# Patient Record
Sex: Male | Born: 1950 | Race: White | Hispanic: No | Marital: Married | Smoking: Never smoker
Health system: Southern US, Community
[De-identification: ages and names within clinical notes are randomized; demographics above are authoritative.]

## PROBLEM LIST (undated history)

## (undated) DIAGNOSIS — R112 Nausea with vomiting, unspecified: Secondary | ICD-10-CM

## (undated) DIAGNOSIS — M1990 Unspecified osteoarthritis, unspecified site: Secondary | ICD-10-CM

## (undated) DIAGNOSIS — R609 Edema, unspecified: Secondary | ICD-10-CM

## (undated) DIAGNOSIS — R0600 Dyspnea, unspecified: Secondary | ICD-10-CM

## (undated) DIAGNOSIS — T4145XA Adverse effect of unspecified anesthetic, initial encounter: Secondary | ICD-10-CM

## (undated) DIAGNOSIS — G4730 Sleep apnea, unspecified: Secondary | ICD-10-CM

## (undated) DIAGNOSIS — M109 Gout, unspecified: Secondary | ICD-10-CM

## (undated) DIAGNOSIS — K766 Portal hypertension: Secondary | ICD-10-CM

## (undated) DIAGNOSIS — D649 Anemia, unspecified: Secondary | ICD-10-CM

## (undated) DIAGNOSIS — Z9889 Other specified postprocedural states: Secondary | ICD-10-CM

## (undated) DIAGNOSIS — R0602 Shortness of breath: Secondary | ICD-10-CM

## (undated) DIAGNOSIS — T8859XA Other complications of anesthesia, initial encounter: Secondary | ICD-10-CM

## (undated) DIAGNOSIS — K5900 Constipation, unspecified: Secondary | ICD-10-CM

## (undated) DIAGNOSIS — R279 Unspecified lack of coordination: Secondary | ICD-10-CM

## (undated) DIAGNOSIS — K219 Gastro-esophageal reflux disease without esophagitis: Secondary | ICD-10-CM

## (undated) DIAGNOSIS — E7800 Pure hypercholesterolemia, unspecified: Secondary | ICD-10-CM

## (undated) DIAGNOSIS — I10 Essential (primary) hypertension: Secondary | ICD-10-CM

## (undated) DIAGNOSIS — D696 Thrombocytopenia, unspecified: Secondary | ICD-10-CM

## (undated) DIAGNOSIS — R161 Splenomegaly, not elsewhere classified: Secondary | ICD-10-CM

## (undated) DIAGNOSIS — E119 Type 2 diabetes mellitus without complications: Secondary | ICD-10-CM

## (undated) DIAGNOSIS — N189 Chronic kidney disease, unspecified: Secondary | ICD-10-CM

## (undated) DIAGNOSIS — K7460 Unspecified cirrhosis of liver: Secondary | ICD-10-CM

## (undated) DIAGNOSIS — G5600 Carpal tunnel syndrome, unspecified upper limb: Secondary | ICD-10-CM

## (undated) DIAGNOSIS — M6281 Muscle weakness (generalized): Secondary | ICD-10-CM

## (undated) DIAGNOSIS — R42 Dizziness and giddiness: Secondary | ICD-10-CM

## (undated) DIAGNOSIS — M7070 Other bursitis of hip, unspecified hip: Secondary | ICD-10-CM

## (undated) HISTORY — PX: ROTATOR CUFF REPAIR: SHX139

## (undated) HISTORY — PX: CATARACT EXTRACTION W/ INTRAOCULAR LENS IMPLANT: SHX1309

## (undated) HISTORY — PX: EYE SURGERY: SHX253

## (undated) HISTORY — PX: LIVER BIOPSY: SHX301

## (undated) HISTORY — PX: COLONOSCOPY: SHX174

## (undated) HISTORY — PX: JOINT REPLACEMENT: SHX530

## (undated) HISTORY — PX: CARPAL TUNNEL RELEASE: SHX101

---
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
## 2008-11-10 ENCOUNTER — Ambulatory Visit: Payer: Self-pay | Admitting: Endocrinology

## 2009-05-14 ENCOUNTER — Ambulatory Visit: Payer: Self-pay | Admitting: Gastroenterology

## 2012-07-29 ENCOUNTER — Ambulatory Visit: Payer: Self-pay | Admitting: Gastroenterology

## 2012-08-23 LAB — PATHOLOGY REPORT

## 2012-09-09 ENCOUNTER — Ambulatory Visit: Payer: Self-pay

## 2013-03-26 ENCOUNTER — Ambulatory Visit: Payer: Self-pay

## 2013-03-26 LAB — COMPREHENSIVE METABOLIC PANEL
Albumin: 4.1 g/dL (ref 3.4–5.0)
Alkaline Phosphatase: 107 U/L
Anion Gap: 11 (ref 7–16)
BUN: 58 mg/dL — AB (ref 7–18)
Bilirubin,Total: 0.6 mg/dL (ref 0.2–1.0)
CALCIUM: 9.8 mg/dL (ref 8.5–10.1)
CHLORIDE: 107 mmol/L (ref 98–107)
Co2: 19 mmol/L — ABNORMAL LOW (ref 21–32)
Creatinine: 2.81 mg/dL — ABNORMAL HIGH (ref 0.60–1.30)
EGFR (African American): 27 — ABNORMAL LOW
GFR CALC NON AF AMER: 23 — AB
GLUCOSE: 150 mg/dL — AB (ref 65–99)
OSMOLALITY: 293 (ref 275–301)
Potassium: 5.6 mmol/L — ABNORMAL HIGH (ref 3.5–5.1)
SGOT(AST): 52 U/L — ABNORMAL HIGH (ref 15–37)
SGPT (ALT): 95 U/L — ABNORMAL HIGH (ref 12–78)
SODIUM: 137 mmol/L (ref 136–145)
Total Protein: 8.5 g/dL — ABNORMAL HIGH (ref 6.4–8.2)

## 2013-03-26 LAB — CBC WITH DIFFERENTIAL/PLATELET
Basophil #: 0.1 10*3/uL (ref 0.0–0.1)
Basophil %: 0.3 %
Eosinophil #: 0.9 10*3/uL — ABNORMAL HIGH (ref 0.0–0.7)
Eosinophil %: 5.5 %
HCT: 55.9 % — ABNORMAL HIGH (ref 40.0–52.0)
HGB: 18.7 g/dL — ABNORMAL HIGH (ref 13.0–18.0)
Lymphocyte #: 1.8 10*3/uL (ref 1.0–3.6)
Lymphocyte %: 11 %
MCH: 32.4 pg (ref 26.0–34.0)
MCHC: 33.5 g/dL (ref 32.0–36.0)
MCV: 97 fL (ref 80–100)
Monocyte #: 1.3 x10 3/mm — ABNORMAL HIGH (ref 0.2–1.0)
Monocyte %: 8 %
Neutrophil #: 12.1 10*3/uL — ABNORMAL HIGH (ref 1.4–6.5)
Neutrophil %: 75.2 %
Platelet: 198 10*3/uL (ref 150–440)
RBC: 5.77 10*6/uL (ref 4.40–5.90)
RDW: 14.4 % (ref 11.5–14.5)
WBC: 16 10*3/uL — ABNORMAL HIGH (ref 3.8–10.6)

## 2013-03-27 LAB — CLOSTRIDIUM DIFFICILE(ARMC)

## 2013-03-30 LAB — STOOL CULTURE

## 2013-10-27 ENCOUNTER — Ambulatory Visit: Payer: Self-pay | Admitting: Emergency Medicine

## 2013-10-27 LAB — URINALYSIS, COMPLETE
Bacteria: NEGATIVE
Bilirubin,UR: NEGATIVE
Blood: NEGATIVE
Glucose,UR: NEGATIVE
KETONE: NEGATIVE
LEUKOCYTE ESTERASE: NEGATIVE
Nitrite: NEGATIVE
Ph: 5 (ref 5.0–8.0)
Protein: NEGATIVE
RBC, UR: NONE SEEN /HPF (ref 0–5)
Specific Gravity: 1.01 (ref 1.000–1.030)
Squamous Epithelial: NONE SEEN

## 2014-11-14 ENCOUNTER — Ambulatory Visit
Admission: EM | Admit: 2014-11-14 | Discharge: 2014-11-14 | Disposition: A | Discharge status: Home / Self Care | Payer: Commercial Managed Care - PPO | Attending: Family Medicine | Admitting: Family Medicine

## 2014-11-14 DIAGNOSIS — H8110 Benign paroxysmal vertigo, unspecified ear: Secondary | ICD-10-CM

## 2014-11-14 HISTORY — DX: Gout, unspecified: M10.9

## 2014-11-14 HISTORY — DX: Dizziness and giddiness: R42

## 2014-11-14 HISTORY — DX: Gastro-esophageal reflux disease without esophagitis: K21.9

## 2014-11-14 HISTORY — DX: Pure hypercholesterolemia, unspecified: E78.00

## 2014-11-14 HISTORY — DX: Type 2 diabetes mellitus without complications: E11.9

## 2014-11-14 HISTORY — DX: Essential (primary) hypertension: I10

## 2014-11-14 MED ORDER — MECLIZINE HCL 25 MG PO TABS: 25.0000 mg | ORAL_TABLET | Freq: Three times a day (TID) | ORAL | Status: DC | PRN

## 2014-11-14 MED ORDER — ONDANSETRON 8 MG PO TBDP: 8.0000 mg | ORAL_TABLET | Freq: Two times a day (BID) | ORAL | Status: DC

## 2014-11-14 NOTE — Discharge Instructions (Signed)
Benign Positional Vertigo Vertigo is the feeling that you or your surroundings are moving when they are not. Benign positional vertigo is the most common form of vertigo. The cause of this condition is not serious (is benign). This condition is triggered by certain movements and positions (is positional). This condition can be dangerous if it occurs while you are doing something that could endanger you or others, such as driving.  CAUSES In many cases, the cause of this condition is not known. It may be caused by a disturbance in an area of the inner ear that helps your brain to sense movement and balance. This disturbance can be caused by a viral infection (labyrinthitis), head injury, or repetitive motion. RISK FACTORS This condition is more likely to develop in: 1. Women. 2. People who are 50 years of age or older. SYMPTOMS Symptoms of this condition usually happen when you move your head or your eyes in different directions. Symptoms may start suddenly, and they usually last for less than a minute. Symptoms may include:  Loss of balance and falling.  Feeling like you are spinning or moving.  Feeling like your surroundings are spinning or moving.  Nausea and vomiting.  Blurred vision.  Dizziness.  Involuntary eye movement (nystagmus). Symptoms can be mild and cause only slight annoyance, or they can be severe and interfere with daily life. Episodes of benign positional vertigo may return (recur) over time, and they may be triggered by certain movements. Symptoms may improve over time. DIAGNOSIS This condition is usually diagnosed by medical history and a physical exam of the head, neck, and ears. You may be referred to a health care provider who specializes in ear, nose, and throat (ENT) problems (otolaryngologist) or a provider who specializes in disorders of the nervous system (neurologist). You may have additional testing, including:  MRI.  A CT scan.  Eye movement tests. Your  health care provider may ask you to change positions quickly while he or she watches you for symptoms of benign positional vertigo, such as nystagmus. Eye movement may be tested with an electronystagmogram (ENG), caloric stimulation, the Dix-Hallpike test, or the roll test.  An electroencephalogram (EEG). This records electrical activity in your brain.  Hearing tests. TREATMENT Usually, your health care provider will treat this by moving your head in specific positions to adjust your inner ear back to normal. Surgery may be needed in severe cases, but this is rare. In some cases, benign positional vertigo may resolve on its own in 2-4 weeks. HOME CARE INSTRUCTIONS Safety  Move slowly.Avoid sudden body or head movements.  Avoid driving.  Avoid operating heavy machinery.  Avoid doing any tasks that would be dangerous to you or others if a vertigo episode would occur.  If you have trouble walking or keeping your balance, try using a cane for stability. If you feel dizzy or unstable, sit down right away.  Return to your normal activities as told by your health care provider. Ask your health care provider what activities are safe for you. General Instructions  Take over-the-counter and prescription medicines only as told by your health care provider.  Avoid certain positions or movements as told by your health care provider.  Drink enough fluid to keep your urine clear or pale yellow.  Keep all follow-up visits as told by your health care provider. This is important. SEEK MEDICAL CARE IF:  You have a fever.  Your condition gets worse or you develop new symptoms.  Your family or friends   notice any behavioral changes.  Your nausea or vomiting gets worse.  You have numbness or a "pins and needles" sensation. SEEK IMMEDIATE MEDICAL CARE IF:  You have difficulty speaking or moving.  You are always dizzy.  You faint.  You develop severe headaches.  You have weakness in your  legs or arms.  You have changes in your hearing or vision.  You develop a stiff neck.  You develop sensitivity to light.   This information is not intended to replace advice given to you by your health care provider. Make sure you discuss any questions you have with your health care provider.   Document Released: 09/30/2005 Document Revised: 09/13/2014 Document Reviewed: 04/17/2014 Elsevier Interactive Patient Education 2016 [employer] Epley Maneuver Self-Care WHAT IS THE EPLEY MANEUVER? The Epley maneuver is an exercise you can do to relieve symptoms of benign paroxysmal positional vertigo (BPPV). This condition is often just referred to as vertigo. BPPV is caused by the movement of tiny crystals (canaliths) inside your inner ear. The accumulation and movement of canaliths in your inner ear causes a sudden spinning sensation (vertigo) when you move your head to certain positions. Vertigo usually lasts about 30 seconds. BPPV usually occurs in just one ear. If you get vertigo when you lie on your left side, you probably have BPPV in your left ear. Your health care provider can tell you which ear is involved.  BPPV may be caused by a head injury. Many people older than 50 get BPPV for unknown reasons. If you have been diagnosed with BPPV, your health care provider may teach you how to do this maneuver. BPPV is not life threatening (benign) and usually goes away in time.  WHEN SHOULD I PERFORM THE EPLEY MANEUVER? You can do this maneuver at home whenever you have symptoms of vertigo. You may do the Epley maneuver up to 3 times a day until your symptoms of vertigo go away. HOW SHOULD I DO THE EPLEY MANEUVER? 3. Sit on the edge of a bed or table with your back straight. Your legs should be extended or hanging over the edge of the bed or table.  4. Turn your head halfway toward the affected ear.  5. Lie backward quickly with your head turned until you are lying flat on your back. You may want to  position a pillow under your shoulders.  6. Hold this position for 30 seconds. You may experience an attack of vertigo. This is normal. Hold this position until the vertigo stops. 7. Then turn your head to the opposite direction until your unaffected ear is facing the floor.  8. Hold this position for 30 seconds. You may experience an attack of vertigo. This is normal. Hold this position until the vertigo stops. 9. Now turn your whole body to the same side as your head. Hold for another 30 seconds.  10. You can then sit back up. ARE THERE RISKS TO THIS MANEUVER? In some cases, you may have other symptoms (such as changes in your vision, weakness, or numbness). If you have these symptoms, stop doing the maneuver and call your health care provider. Even if doing these maneuvers relieves your vertigo, you may still have dizziness. Dizziness is the sensation of light-headedness but without the sensation of movement. Even though the Epley maneuver may relieve your vertigo, it is possible that your symptoms will return within 5 years. WHAT SHOULD I DO AFTER THIS MANEUVER? After doing the Epley maneuver, you can return to your normal   activities. Ask your doctor if there is anything you should do at home to prevent vertigo. This may include:  Sleeping with two or more pillows to keep your head elevated.  Not sleeping on the side of your affected ear.  Getting up slowly from bed.  Avoiding sudden movements during the day.  Avoiding extreme head movement, like looking up or bending over.  Wearing a cervical collar to prevent sudden head movements. WHAT SHOULD I DO IF MY SYMPTOMS GET WORSE? Call your health care provider if your vertigo gets worse. Call your provider right way if you have other symptoms, including:   Nausea.  Vomiting.  Headache.  Weakness.  Numbness.  Vision changes.   This information is not intended to replace advice given to you by your health care provider. Make  sure you discuss any questions you have with your health care provider.   Document Released: 12/28/2012 Document Reviewed: 12/28/2012 Elsevier Interactive Patient Education 2016 Elsevier Inc.  

## 2014-11-14 NOTE — ED Notes (Signed)
Started sudden onset at midnite when got up off the couch being very dizzy. Went to bed and at 2am became very dizzy and vomited. Vomited x 4. Took Zofran which helped. Got out of bed at 9am and remained somewhat dizzy and nauseated-took another Zofran. Normal BM yesterday, no diarrhea.

## 2014-11-14 NOTE — ED Provider Notes (Signed)
CSN: [id number]     Arrival date & time 11/14/14  1013 History   None    Chief Complaint  Patient presents with  . Dizziness   (Consider location/radiation/quality/duration/timing/severity/associated sxs/prior Treatment) HPI Comments: 64 yo male presents with a complaint of sudden onset of dizziness last night before going to bed. States he got off the couch to go the bed and had vertigo ("felt like the room was spinning"). Went to bed but woke up around 2am and had the same vertigo symptoms. Vomited several times then and took a zofran which helped. This morning around 9am had similar vertigo episode and took another zofran. Currently states feels a little better and denies any dizziness or vertigo. Denies any vision changes, numbness/tingling, weakness, speech or swallowing problems.   The history is provided by the patient.    Past Medical History  Diagnosis Date  . Diabetes mellitus without complication (HCC)   . Hypertension   . Hypercholesteremia   . GERD (gastroesophageal reflux disease)   . Vertigo   . Gout    Past Surgical History  Procedure Laterality Date  . Joint replacement     Family History  Problem Relation Age of Onset  . Cancer Father    Social History  Substance Use Topics  . Smoking status: Never Smoker   . Smokeless tobacco: None  . Alcohol Use: No    Review of Systems  Allergies  Review of patient's allergies indicates no known allergies.  Home Medications   Prior to Admission medications   Medication Sig Start Date End Date Taking? Authorizing Provider  aspirin 81 MG tablet Take 81 mg by mouth daily.   Yes Historical Provider, MD  ergocalciferol (VITAMIN D2) 50000 UNITS capsule Take 50,000 Units by mouth once a week.   Yes Historical Provider, MD  ezetimibe (ZETIA) 10 MG tablet Take 10 mg by mouth daily.   Yes Historical Provider, MD  febuxostat (ULORIC) 40 MG tablet Take 40 mg by mouth daily.   Yes Historical Provider, MD  insulin glargine  (LANTUS) 100 UNIT/ML injection Inject 60 Units into the skin at bedtime.   Yes Historical Provider, MD  lisinopril-hydrochlorothiazide (PRINZIDE,ZESTORETIC) 20-25 MG tablet Take 1 tablet by mouth daily.   Yes Historical Provider, MD  omeprazole (PRILOSEC) 40 MG capsule Take 40 mg by mouth daily.   Yes Historical Provider, MD  sitaGLIPtin-metformin (JANUMET) 50-1000 MG tablet Take 1 tablet by mouth 2 (two) times daily with a meal.   Yes Historical Provider, MD  meclizine (ANTIVERT) 25 MG tablet Take 1 tablet (25 mg total) by mouth 3 (three) times daily as needed. 11/14/14   Payton Mccallumrlando Pericles Carmicheal, MD  ondansetron (ZOFRAN ODT) 8 MG disintegrating tablet Take 1 tablet (8 mg total) by mouth 2 (two) times daily. 11/14/14   Payton Mccallumrlando Mellonie Guess, MD   Meds Ordered and Administered this Visit  Medications - No data to display  BP 116/74 mmHg  Pulse 58  Temp(Src) 96.9 F (36.1 C) (Tympanic)  Resp 16  Ht 5\' 9"  (1.753 m)  Wt 255 lb (115.667 kg)  BMI 37.64 kg/m2  SpO2 98% Orthostatic VS for the past 24 hrs:  BP- Lying Pulse- Lying BP- Sitting Pulse- Sitting BP- Standing at 0 minutes Pulse- Standing at 0 minutes  11/14/14 1042 126/71 mmHg 58 131/73 mmHg 59 124/75 mmHg 74    Physical Exam  Constitutional: He is oriented to person, place, and time. He appears well-developed and well-nourished. No distress.  Eyes: EOM are normal. Pupils are equal,  round, and reactive to light. Right eye exhibits no discharge. Left eye exhibits no discharge.  Neck: Neck supple. No tracheal deviation present. No thyromegaly present.  Cardiovascular: Regular rhythm and normal heart sounds.   Pulmonary/Chest: Effort normal. No respiratory distress.  Lymphadenopathy:    He has no cervical adenopathy.  Neurological: He is alert and oriented to person, place, and time. He has normal strength and normal reflexes. He displays normal reflexes. No cranial nerve deficit or sensory deficit. He exhibits normal muscle tone. Coordination normal.   Positive Hallpike maneuver  Skin: No rash noted. He is not diaphoretic.  Psychiatric: He has a normal mood and affect.  Nursing note and vitals reviewed.   ED Course  Procedures (including critical care time)  Labs Review Labs Reviewed - No data to display  Imaging Review No results found.   Visual Acuity Review  Right Eye Distance:   Left Eye Distance:   Bilateral Distance:    Right Eye Near:   Left Eye Near:    Bilateral Near:         MDM   1. Benign positional vertigo, unspecified laterality    Discharge Medication List as of 11/14/2014 11:58 AM    START taking these medications   Details  meclizine (ANTIVERT) 25 MG tablet Take 1 tablet (25 mg total) by mouth 3 (three) times daily as needed., Starting 11/14/2014, Until Discontinued, Normal    ondansetron (ZOFRAN ODT) 8 MG disintegrating tablet Take 1 tablet (8 mg total) by mouth 2 (two) times daily., Starting 11/14/2014, Until Discontinued, Normal      1.  diagnosis reviewed with patient 2. rx as per orders above; reviewed possible side effects, interactions, risks and benefits  3. Recommend supportive treatment with modified Epley maneuver exercises at home 4. Follow-up prn if symptoms worsen or don't improve    Payton Mccallum, MD 11/14/14 1243

## 2015-01-02 ENCOUNTER — Other Ambulatory Visit: Payer: Self-pay | Admitting: Otolaryngology

## 2015-01-02 DIAGNOSIS — H814 Vertigo of central origin: Secondary | ICD-10-CM

## 2015-01-02 DIAGNOSIS — R42 Dizziness and giddiness: Secondary | ICD-10-CM

## 2015-01-04 ENCOUNTER — Ambulatory Visit: Payer: Commercial Managed Care - PPO

## 2017-05-13 NOTE — Anesthesia Pain Management Evaluation Note (Signed)
AT REQUEST OF DR DIAZ OFFICE , DISCUSSED PATIENT HISTORY WITH DR A KARENZ. IF CLEARED BY MEDICAL AND GI AND ASYMPTOMATIC CAN HAVE SURGERY AT THIS FACILITY. Dillon Graves AT DR Trisha Mangle NOTIFIED

## 2017-07-15 HISTORY — PX: CHOLECYSTECTOMY: SHX55

## 2017-09-11 ENCOUNTER — Other Ambulatory Visit: Payer: Self-pay | Admitting: Internal Medicine

## 2017-09-11 DIAGNOSIS — K7460 Unspecified cirrhosis of liver: Secondary | ICD-10-CM

## 2017-09-17 ENCOUNTER — Ambulatory Visit: Payer: Commercial Managed Care - PPO

## 2017-09-23 ENCOUNTER — Ambulatory Visit
Admission: RE | Admit: 2017-09-23 | Discharge: 2017-09-23 | Disposition: A | Discharge status: Home / Self Care | Payer: Commercial Managed Care - PPO | Source: Ambulatory Visit | Attending: Internal Medicine | Admitting: Internal Medicine

## 2017-09-23 DIAGNOSIS — Z9049 Acquired absence of other specified parts of digestive tract: Secondary | ICD-10-CM | POA: Diagnosis not present

## 2017-09-23 DIAGNOSIS — K766 Portal hypertension: Secondary | ICD-10-CM | POA: Diagnosis present

## 2017-09-23 DIAGNOSIS — K7460 Unspecified cirrhosis of liver: Secondary | ICD-10-CM | POA: Insufficient documentation

## 2018-03-12 ENCOUNTER — Other Ambulatory Visit: Payer: Self-pay | Admitting: Internal Medicine

## 2018-03-12 DIAGNOSIS — K7460 Unspecified cirrhosis of liver: Secondary | ICD-10-CM

## 2018-03-18 ENCOUNTER — Other Ambulatory Visit: Payer: Self-pay

## 2018-03-18 ENCOUNTER — Ambulatory Visit
Admission: RE | Admit: 2018-03-18 | Discharge: 2018-03-18 | Disposition: A | Discharge status: Home / Self Care | Payer: Commercial Managed Care - PPO | Source: Ambulatory Visit | Attending: Internal Medicine | Admitting: Internal Medicine

## 2018-03-18 DIAGNOSIS — K7460 Unspecified cirrhosis of liver: Secondary | ICD-10-CM | POA: Insufficient documentation

## 2018-05-11 ENCOUNTER — Ambulatory Visit: Admit: 2018-05-11 | Payer: Commercial Managed Care - PPO | Admitting: Internal Medicine

## 2018-05-11 SURGERY — ESOPHAGOGASTRODUODENOSCOPY (EGD) WITH PROPOFOL
Anesthesia: General

## 2018-06-04 ENCOUNTER — Encounter
Admission: RE | Admit: 2018-06-04 | Discharge: 2018-06-04 | Disposition: A | Discharge status: Home / Self Care | Payer: Commercial Managed Care - PPO | Source: Ambulatory Visit | Attending: Internal Medicine | Admitting: Internal Medicine

## 2018-06-04 ENCOUNTER — Other Ambulatory Visit: Payer: Self-pay

## 2018-06-04 DIAGNOSIS — Z1159 Encounter for screening for other viral diseases: Secondary | ICD-10-CM | POA: Diagnosis present

## 2018-06-05 LAB — NOVEL CORONAVIRUS, NAA (HOSP ORDER, SEND-OUT TO REF LAB; TAT 18-24 HRS): SARS-CoV-2, NAA: NOT DETECTED

## 2018-06-08 ENCOUNTER — Encounter: Payer: Self-pay | Admitting: *Deleted

## 2018-06-09 ENCOUNTER — Ambulatory Visit: Payer: Commercial Managed Care - PPO | Admitting: Anesthesiology

## 2018-06-09 ENCOUNTER — Encounter
Admission: RE | Disposition: A | Discharge status: Home / Self Care | Payer: Self-pay | Source: Home / Self Care | Attending: Internal Medicine

## 2018-06-09 ENCOUNTER — Encounter: Payer: Self-pay | Admitting: Internal Medicine

## 2018-06-09 ENCOUNTER — Other Ambulatory Visit: Payer: Self-pay

## 2018-06-09 ENCOUNTER — Ambulatory Visit
Admission: RE | Admit: 2018-06-09 | Discharge: 2018-06-09 | Disposition: A | Discharge status: Home / Self Care | Payer: Commercial Managed Care - PPO | Attending: Internal Medicine | Admitting: Internal Medicine

## 2018-06-09 DIAGNOSIS — Z7982 Long term (current) use of aspirin: Secondary | ICD-10-CM | POA: Diagnosis not present

## 2018-06-09 DIAGNOSIS — K766 Portal hypertension: Secondary | ICD-10-CM | POA: Insufficient documentation

## 2018-06-09 DIAGNOSIS — K7469 Other cirrhosis of liver: Secondary | ICD-10-CM | POA: Diagnosis not present

## 2018-06-09 DIAGNOSIS — Z794 Long term (current) use of insulin: Secondary | ICD-10-CM | POA: Diagnosis not present

## 2018-06-09 DIAGNOSIS — M109 Gout, unspecified: Secondary | ICD-10-CM | POA: Insufficient documentation

## 2018-06-09 DIAGNOSIS — Z6836 Body mass index (BMI) 36.0-36.9, adult: Secondary | ICD-10-CM | POA: Diagnosis not present

## 2018-06-09 DIAGNOSIS — E119 Type 2 diabetes mellitus without complications: Secondary | ICD-10-CM | POA: Diagnosis not present

## 2018-06-09 DIAGNOSIS — R42 Dizziness and giddiness: Secondary | ICD-10-CM | POA: Insufficient documentation

## 2018-06-09 DIAGNOSIS — K2980 Duodenitis without bleeding: Secondary | ICD-10-CM | POA: Insufficient documentation

## 2018-06-09 DIAGNOSIS — Z79899 Other long term (current) drug therapy: Secondary | ICD-10-CM | POA: Diagnosis not present

## 2018-06-09 DIAGNOSIS — I10 Essential (primary) hypertension: Secondary | ICD-10-CM | POA: Insufficient documentation

## 2018-06-09 DIAGNOSIS — G5600 Carpal tunnel syndrome, unspecified upper limb: Secondary | ICD-10-CM | POA: Diagnosis not present

## 2018-06-09 DIAGNOSIS — I8510 Secondary esophageal varices without bleeding: Secondary | ICD-10-CM | POA: Insufficient documentation

## 2018-06-09 DIAGNOSIS — K317 Polyp of stomach and duodenum: Secondary | ICD-10-CM | POA: Diagnosis not present

## 2018-06-09 DIAGNOSIS — Z885 Allergy status to narcotic agent status: Secondary | ICD-10-CM | POA: Diagnosis not present

## 2018-06-09 DIAGNOSIS — K219 Gastro-esophageal reflux disease without esophagitis: Secondary | ICD-10-CM | POA: Diagnosis not present

## 2018-06-09 DIAGNOSIS — K3189 Other diseases of stomach and duodenum: Secondary | ICD-10-CM | POA: Insufficient documentation

## 2018-06-09 DIAGNOSIS — E669 Obesity, unspecified: Secondary | ICD-10-CM | POA: Diagnosis not present

## 2018-06-09 DIAGNOSIS — E7800 Pure hypercholesterolemia, unspecified: Secondary | ICD-10-CM | POA: Diagnosis not present

## 2018-06-09 HISTORY — DX: Carpal tunnel syndrome, unspecified upper limb: G56.00

## 2018-06-09 HISTORY — PX: ESOPHAGOGASTRODUODENOSCOPY (EGD) WITH PROPOFOL: SHX5813

## 2018-06-09 HISTORY — DX: Unspecified cirrhosis of liver: K74.60

## 2018-06-09 LAB — GLUCOSE, CAPILLARY: Glucose-Capillary: 116 mg/dL — ABNORMAL HIGH (ref 70–99)

## 2018-06-09 SURGERY — ESOPHAGOGASTRODUODENOSCOPY (EGD) WITH PROPOFOL
Anesthesia: General

## 2018-06-09 MED ORDER — PROPOFOL 500 MG/50ML IV EMUL
INTRAVENOUS | Status: AC
  Filled 2018-06-09: qty 50

## 2018-06-09 MED ORDER — ACETAMINOPHEN 500 MG PO TABS
ORAL_TABLET | ORAL | Status: AC
  Administered 2018-06-09: 09:00:00
  Filled 2018-06-09: qty 2

## 2018-06-09 MED ORDER — LIDOCAINE HCL (CARDIAC) PF 100 MG/5ML IV SOSY
PREFILLED_SYRINGE | INTRAVENOUS | Status: DC | PRN
  Administered 2018-06-09: 100 mg via INTRAVENOUS

## 2018-06-09 MED ORDER — EPHEDRINE SULFATE 50 MG/ML IJ SOLN
INTRAMUSCULAR | Status: DC | PRN
  Administered 2018-06-09: 15 mg via INTRAVENOUS

## 2018-06-09 MED ORDER — SODIUM CHLORIDE 0.9 % IV SOLN
INTRAVENOUS | Status: DC
  Administered 2018-06-09 (×2): via INTRAVENOUS

## 2018-06-09 MED ORDER — PROPOFOL 10 MG/ML IV BOLUS
INTRAVENOUS | Status: DC | PRN
  Administered 2018-06-09: 60 mg via INTRAVENOUS
  Administered 2018-06-09: 40 mg via INTRAVENOUS
  Administered 2018-06-09: 60 mg via INTRAVENOUS
  Administered 2018-06-09: 20 mg via INTRAVENOUS
  Administered 2018-06-09: 40 mg via INTRAVENOUS
  Administered 2018-06-09: 100 mg via INTRAVENOUS
  Administered 2018-06-09: 40 mg via INTRAVENOUS

## 2018-06-09 MED ORDER — PROPOFOL 10 MG/ML IV BOLUS
INTRAVENOUS | Status: AC
  Filled 2018-06-09: qty 20

## 2018-06-09 NOTE — Anesthesia Preprocedure Evaluation (Signed)
Anesthesia Evaluation  Patient identified by MRN, date of birth, ID band Patient awake    Reviewed: Allergy & Precautions, NPO status , Patient's Chart, lab work & pertinent test results  History of Anesthesia Complications Negative for: history of anesthetic complications  Airway Mallampati: II  TM Distance: >3 FB Neck ROM: Full    Dental  (+) Poor Dentition   Pulmonary neg pulmonary ROS, neg sleep apnea, neg COPD,    breath sounds clear to auscultation- rhonchi (-) wheezing      Cardiovascular hypertension, Pt. on medications (-) CAD, (-) Past MI, (-) Cardiac Stents and (-) CABG  Rhythm:Regular Rate:Normal - Systolic murmurs and - Diastolic murmurs    Neuro/Psych neg Seizures negative psych ROS   GI/Hepatic GERD  ,(+) Cirrhosis       ,   Endo/Other  diabetes, Insulin Dependent  Renal/GU Renal InsufficiencyRenal disease     Musculoskeletal negative musculoskeletal ROS (+)   Abdominal (+) + obese,   Peds  Hematology negative hematology ROS (+)   Anesthesia Other Findings Past Medical History: No date: Carpal tunnel syndrome No date: Cirrhosis of liver without ascites (HCC) No date: Diabetes mellitus without complication (HCC) No date: GERD (gastroesophageal reflux disease) No date: Gout No date: Gout No date: Hypercholesteremia No date: Hypertension No date: Vertigo   Reproductive/Obstetrics                             Anesthesia Physical Anesthesia Plan  ASA: III  Anesthesia Plan: General   Post-op Pain Management:    Induction: Intravenous  PONV Risk Score and Plan: 1 and Propofol infusion  Airway Management Planned: Natural Airway  Additional Equipment:   Intra-op Plan:   Post-operative Plan:   Informed Consent: I have reviewed the patients History and Physical, chart, labs and discussed the procedure including the risks, benefits and alternatives for the  proposed anesthesia with the patient or authorized representative who has indicated his/her understanding and acceptance.     Dental advisory given  Plan Discussed with: CRNA and Anesthesiologist  Anesthesia Plan Comments:         Anesthesia Quick Evaluation

## 2018-06-09 NOTE — Transfer of Care (Signed)
Immediate Anesthesia Transfer of Care Note  Patient: Dillon Graves  Procedure(s) Performed: ESOPHAGOGASTRODUODENOSCOPY (EGD) WITH PROPOFOL (N/A )  Patient Location: PACU  Anesthesia Type:General  Level of Consciousness: sedated  Airway & Oxygen Therapy: Patient Spontanous Breathing and Patient connected to nasal cannula oxygen  Post-op Assessment: Report given to RN and Post -op Vital signs reviewed and stable  Post vital signs: Reviewed and stable  Last Vitals:  Vitals Value Taken Time  BP    Temp 36.2 C 06/09/2018  8:46 AM  Pulse 79 06/09/2018  8:45 AM  Resp 22 06/09/2018  8:45 AM  SpO2 93 % 06/09/2018  8:45 AM  Vitals shown include unvalidated device data.  Last Pain:  Vitals:   06/09/18 0846  TempSrc: Tympanic         Complications: No apparent anesthesia complications

## 2018-06-09 NOTE — Op Note (Signed)
[facility] Patient Name: Dillon Graves Procedure Date: 06/09/2018 7:27 AM MRN: [id number] Account #: [id number] Date of Birth: [birth year] Admit Type: Outpatient Age: 68 Room: [facility] ENDO ROOM 4 Gender: Male Note Status: Finalized Procedure:            Upper GI endoscopy Indications:          1st degree variceal surveillance (known small varices,                        no prior bleeding) Providers:            Boykin Nearing. Samaa Ueda MD, MD Medicines:            Propofol per Anesthesia Complications:        No immediate complications. Procedure:            Pre-Anesthesia Assessment:                       - The risks and benefits of the procedure and the                        sedation options and risks were discussed with the                        patient. All questions were answered and informed                        consent was obtained.                       - Patient identification and proposed procedure were                        verified prior to the procedure by the physician and                        the nurse. The procedure was verified in the procedure                        room.                       - ASA Grade Assessment: III - A patient with severe                        systemic disease.                       - After reviewing the risks and benefits, the patient                        was deemed in satisfactory condition to undergo the                        procedure.                       After obtaining informed consent, the endoscope was                        passed under direct vision. Throughout the procedure,  the patient's blood pressure, pulse, and oxygen                        saturations were monitored continuously. The Endoscope                        was introduced through the mouth, and advanced to the                        third part of duodenum. The upper GI endoscopy was   accomplished without difficulty. The patient tolerated                        the procedure well. Findings:      Three columns of non-bleeding grade III varices were found in the middle       third of the esophagus and in the lower third of the esophagus, 24 to 35       cm from the incisors. They were 8 mm in largest diameter. Stigmata of       recent bleeding were evident and no red wale signs were present. The       varices appeared larger than they were at prior exam. Three bands were       successfully placed with complete eradication, resulting in deflation of       varices. There was no bleeding during and at the end of the procedure.       Estimated blood loss: none.      Severe portal hypertensive gastropathy was found in the entire examined       stomach.      A single 12 mm pedunculated polyp with no bleeding and no stigmata of       recent bleeding was found in the prepyloric region of the stomach.       Biopsies were taken with a cold forceps for histology.      There is no endoscopic evidence of varices in the cardia and in the       gastric fundus.      Diffuse mildly congested mucosa without active bleeding and with no       stigmata of bleeding was found in the entire duodenum. Impression:           - Recently bleeding grade III esophageal varices.                        Completely eradicated. Banded.                       - Portal hypertensive gastropathy.                       - A single gastric polyp. Biopsied.                       - Congested duodenal mucosa. Recommendation:       - Patient has a contact number available for                        emergencies. The signs and symptoms of potential                        delayed complications were discussed with the patient.  Return to normal activities tomorrow. Written discharge                        instructions were provided to the patient.                       - Resume previous diet.                        - Continue present medications.                       - Give a beta blocker with dosage titrated by the heart                        rate.                       - Repeat upper endoscopy in 3 months for retreatment.                       - Return to my office in 1 month.                       - The findings and recommendations were discussed with                        the patient. Procedure Code(s):    --- Professional ---                       [id number], Esophagogastroduodenoscopy, flexible, transoral;                        with band ligation of esophageal/gastric varices                       [id number], Esophagogastroduodenoscopy, flexible, transoral;                        with biopsy, single or multiple Diagnosis Code(s):    --- Professional ---                       K31.89, Other diseases of stomach and duodenum                       K31.7, Polyp of stomach and duodenum                       K76.6, Portal hypertension                       I85.01, Esophageal varices with bleeding CPT copyright 2019 American Medical Association. All rights reserved. The codes documented in this report are preliminary and upon coder review may  be revised to meet current compliance requirements. Stanton Kidneyeodoro K Pierson Vantol MD, MD 06/09/2018 8:51:36 AM This report has been signed electronically. Number of Addenda: 0 Note Initiated On: 06/09/2018 7:27 AM      [facility]

## 2018-06-09 NOTE — Anesthesia Postprocedure Evaluation (Signed)
Anesthesia Post Note  Patient: Dillon Graves  Procedure(s) Performed: ESOPHAGOGASTRODUODENOSCOPY (EGD) WITH PROPOFOL (N/A )  Patient location during evaluation: Endoscopy Anesthesia Type: General Level of consciousness: awake and alert and oriented Pain management: pain level controlled Vital Signs Assessment: post-procedure vital signs reviewed and stable Respiratory status: spontaneous breathing, nonlabored ventilation and respiratory function stable Cardiovascular status: blood pressure returned to baseline and stable Postop Assessment: no signs of nausea or vomiting Anesthetic complications: no     Last Vitals:  Vitals:   06/09/18 0716 06/09/18 0846  BP: 120/66   Pulse: 61   Resp: 16   Temp: 36.5 C (!) 36.2 C  SpO2: 99%     Last Pain:  Vitals:   06/09/18 0947  TempSrc:   PainSc: 2                  [name]

## 2018-06-09 NOTE — Anesthesia Post-op Follow-up Note (Signed)
Anesthesia QCDR form completed.        

## 2018-06-09 NOTE — Interval H&P Note (Signed)
History and Physical Interval Note:  06/09/2018 8:16 AM  Dillon Graves  has presented today for surgery, with the diagnosis of Esophageal Varicies, Portal Hypertension.  The various methods of treatment have been discussed with the patient and family. After consideration of risks, benefits and other options for treatment, the patient has consented to  Procedure(s): ESOPHAGOGASTRODUODENOSCOPY (EGD) WITH PROPOFOL (N/A) as a surgical intervention.  The patient's history has been reviewed, patient examined, no change in status, stable for surgery.  I have reviewed the patient's chart and labs.  Questions were answered to the patient's satisfaction.     [place], [place]

## 2018-06-09 NOTE — H&P (Signed)
Outpatient short stay form Pre-procedure 06/09/2018 8:14 AM Brithney Bensen K. [name], M.D.  Primary Physician: Dr. Kerrie Pleasure  Reason for visit:  Hx of esophageal varices, portal venous hypertension.  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 Has hx of Gr 1 esophageal varices. Denies hemetemesis, abdominal pain, syncope or confusion. No jaundice, fever, or cough.     Current Facility-Administered Medications:  .  0.9 %  sodium chloride infusion, , Intravenous, Continuous, [place], Boykin Nearing, MD, Last Rate: 20 mL/hr at 06/09/18 1657  Medications Prior to Admission  Medication Sig Dispense Refill Last Dose  . aspirin 81 MG tablet Take 81 mg by mouth daily.   Past Week at Unknown time  . ergocalciferol (VITAMIN D2) 50000 UNITS capsule Take 50,000 Units by mouth once a week.   Past Week at Unknown time  . ezetimibe (ZETIA) 10 MG tablet Take 10 mg by mouth daily.   06/08/2018 at Unknown time  . febuxostat (ULORIC) 40 MG tablet Take 40 mg by mouth daily.   06/08/2018 at Unknown time  . insulin glargine (LANTUS) 100 UNIT/ML injection Inject 60 Units into the skin at bedtime.   06/08/2018 at Unknown time  . lisinopril-hydrochlorothiazide (PRINZIDE,ZESTORETIC) 20-25 MG tablet Take 1 tablet by mouth daily.   06/08/2018 at Unknown time  . meclizine (ANTIVERT) 25 MG tablet Take 1 tablet (25 mg total) by mouth 3 (three) times daily as needed. 30 tablet 0 Past Week at Unknown time  . omeprazole (PRILOSEC) 40 MG capsule Take 40 mg by mouth daily.   06/08/2018 at Unknown time  . ondansetron (ZOFRAN ODT) 8 MG disintegrating tablet Take 1 tablet (8 mg total) by mouth 2 (two) times daily. 6 tablet 0 Past Week at Unknown time  . sitaGLIPtin-metformin (JANUMET) 50-1000 MG tablet Take 1 tablet by mouth 2 (two) times daily with a meal.   06/08/2018 at Unknown time     Allergies  Allergen Reactions  . Morphine And Related Nausea And Vomiting     Past Medical  History:  Diagnosis Date  . Carpal tunnel syndrome   . Cirrhosis of liver without ascites (HCC)   . Diabetes mellitus without complication (HCC)   . GERD (gastroesophageal reflux disease)   . Gout   . Gout   . Hypercholesteremia   . Hypertension   . Vertigo     Review of systems:  Otherwise negative.    Physical Exam  Gen: Alert, oriented. Appears stated age.  HEENT: [place]/AT. PERRLA. Lungs: CTA, no wheezes. CV: RR nl S1, S2. Abd: soft, benign, no masses. BS+ Ext: No edema. Pulses 2+    Planned procedures: Proceed with EGD. The patient understands the nature of the planned procedure, indications, risks, alternatives and potential complications including but not limited to bleeding, infection, perforation, damage to internal organs and possible oversedation/side effects from anesthesia. The patient agrees and gives consent to proceed.  Please refer to procedure notes for findings, recommendations and patient disposition/instructions.     Alliah Boulanger K. [name], M.D. Gastroenterology 06/09/2018  8:14 AM

## 2018-06-09 NOTE — OR Nursing (Signed)
PT EXPERIENCING EPIGASTRIC PAIN AFTER BANDING . PT REQUESTING TYLENOL EVEN THOUGH .HAS LIVER DZ.[name] REPORTS HE DOES TAKE OCCASIONAL TYLENOL. DR [facility] NOTIFIED. TYLENOL 1000MG  GIVEN FOR PAIN SCALE 8.

## 2018-06-11 LAB — SURGICAL PATHOLOGY

## 2018-09-17 ENCOUNTER — Other Ambulatory Visit
Admission: RE | Admit: 2018-09-17 | Discharge: 2018-09-17 | Disposition: A | Discharge status: Home / Self Care | Payer: Commercial Managed Care - PPO | Source: Ambulatory Visit | Attending: Internal Medicine | Admitting: Internal Medicine

## 2018-09-17 ENCOUNTER — Other Ambulatory Visit: Payer: Self-pay

## 2018-09-17 DIAGNOSIS — Z20828 Contact with and (suspected) exposure to other viral communicable diseases: Secondary | ICD-10-CM | POA: Insufficient documentation

## 2018-09-17 DIAGNOSIS — Z01812 Encounter for preprocedural laboratory examination: Secondary | ICD-10-CM | POA: Diagnosis not present

## 2018-09-18 LAB — SARS CORONAVIRUS 2 (TAT 6-24 HRS): SARS Coronavirus 2: NEGATIVE

## 2018-09-21 ENCOUNTER — Encounter: Payer: Self-pay | Admitting: *Deleted

## 2018-09-22 ENCOUNTER — Ambulatory Visit: Payer: Commercial Managed Care - PPO | Admitting: Anesthesiology

## 2018-09-22 ENCOUNTER — Encounter
Admission: RE | Disposition: A | Discharge status: Home / Self Care | Payer: Self-pay | Source: Home / Self Care | Attending: Internal Medicine

## 2018-09-22 ENCOUNTER — Ambulatory Visit
Admission: RE | Admit: 2018-09-22 | Discharge: 2018-09-22 | Disposition: A | Discharge status: Home / Self Care | Payer: Commercial Managed Care - PPO | Attending: Internal Medicine | Admitting: Internal Medicine

## 2018-09-22 ENCOUNTER — Other Ambulatory Visit: Payer: Self-pay

## 2018-09-22 ENCOUNTER — Encounter: Payer: Self-pay | Admitting: *Deleted

## 2018-09-22 DIAGNOSIS — K7469 Other cirrhosis of liver: Secondary | ICD-10-CM | POA: Diagnosis not present

## 2018-09-22 DIAGNOSIS — E669 Obesity, unspecified: Secondary | ICD-10-CM | POA: Diagnosis not present

## 2018-09-22 DIAGNOSIS — K766 Portal hypertension: Secondary | ICD-10-CM | POA: Diagnosis not present

## 2018-09-22 DIAGNOSIS — M1990 Unspecified osteoarthritis, unspecified site: Secondary | ICD-10-CM | POA: Diagnosis not present

## 2018-09-22 DIAGNOSIS — M109 Gout, unspecified: Secondary | ICD-10-CM | POA: Diagnosis not present

## 2018-09-22 DIAGNOSIS — K219 Gastro-esophageal reflux disease without esophagitis: Secondary | ICD-10-CM | POA: Diagnosis not present

## 2018-09-22 DIAGNOSIS — E7800 Pure hypercholesterolemia, unspecified: Secondary | ICD-10-CM | POA: Diagnosis not present

## 2018-09-22 DIAGNOSIS — N183 Chronic kidney disease, stage 3 (moderate): Secondary | ICD-10-CM | POA: Insufficient documentation

## 2018-09-22 DIAGNOSIS — Z794 Long term (current) use of insulin: Secondary | ICD-10-CM | POA: Insufficient documentation

## 2018-09-22 DIAGNOSIS — Z7982 Long term (current) use of aspirin: Secondary | ICD-10-CM | POA: Insufficient documentation

## 2018-09-22 DIAGNOSIS — Z79899 Other long term (current) drug therapy: Secondary | ICD-10-CM | POA: Insufficient documentation

## 2018-09-22 DIAGNOSIS — K5900 Constipation, unspecified: Secondary | ICD-10-CM | POA: Diagnosis not present

## 2018-09-22 DIAGNOSIS — D631 Anemia in chronic kidney disease: Secondary | ICD-10-CM | POA: Insufficient documentation

## 2018-09-22 DIAGNOSIS — Z885 Allergy status to narcotic agent status: Secondary | ICD-10-CM | POA: Insufficient documentation

## 2018-09-22 DIAGNOSIS — I8510 Secondary esophageal varices without bleeding: Secondary | ICD-10-CM | POA: Insufficient documentation

## 2018-09-22 DIAGNOSIS — K3189 Other diseases of stomach and duodenum: Secondary | ICD-10-CM | POA: Diagnosis not present

## 2018-09-22 DIAGNOSIS — I129 Hypertensive chronic kidney disease with stage 1 through stage 4 chronic kidney disease, or unspecified chronic kidney disease: Secondary | ICD-10-CM | POA: Diagnosis not present

## 2018-09-22 DIAGNOSIS — Z6836 Body mass index (BMI) 36.0-36.9, adult: Secondary | ICD-10-CM | POA: Diagnosis not present

## 2018-09-22 DIAGNOSIS — E1122 Type 2 diabetes mellitus with diabetic chronic kidney disease: Secondary | ICD-10-CM | POA: Diagnosis not present

## 2018-09-22 DIAGNOSIS — G4730 Sleep apnea, unspecified: Secondary | ICD-10-CM | POA: Insufficient documentation

## 2018-09-22 HISTORY — DX: Sleep apnea, unspecified: G47.30

## 2018-09-22 HISTORY — DX: Other complications of anesthesia, initial encounter: T88.59XA

## 2018-09-22 HISTORY — DX: Anemia, unspecified: D64.9

## 2018-09-22 HISTORY — PX: ESOPHAGOGASTRODUODENOSCOPY (EGD) WITH PROPOFOL: SHX5813

## 2018-09-22 HISTORY — DX: Constipation, unspecified: K59.00

## 2018-09-22 HISTORY — DX: Thrombocytopenia, unspecified: D69.6

## 2018-09-22 HISTORY — DX: Chronic kidney disease, unspecified: N18.9

## 2018-09-22 HISTORY — DX: Other specified postprocedural states: Z98.890

## 2018-09-22 HISTORY — DX: Nausea with vomiting, unspecified: R11.2

## 2018-09-22 HISTORY — DX: Splenomegaly, not elsewhere classified: R16.1

## 2018-09-22 HISTORY — DX: Unspecified osteoarthritis, unspecified site: M19.90

## 2018-09-22 LAB — GLUCOSE, CAPILLARY: Glucose-Capillary: 94 mg/dL (ref 70–99)

## 2018-09-22 SURGERY — ESOPHAGOGASTRODUODENOSCOPY (EGD) WITH PROPOFOL
Anesthesia: General

## 2018-09-22 MED ORDER — ACETAMINOPHEN 500 MG PO TABS
ORAL_TABLET | ORAL | Status: AC
  Administered 2018-09-22: 1000 mg via ORAL
  Filled 2018-09-22: qty 2

## 2018-09-22 MED ORDER — PROPOFOL 10 MG/ML IV BOLUS
INTRAVENOUS | Status: DC | PRN
  Administered 2018-09-22 (×2): 50 mg via INTRAVENOUS

## 2018-09-22 MED ORDER — SODIUM CHLORIDE 0.9 % IV SOLN
INTRAVENOUS | Status: DC
  Administered 2018-09-22 (×2): via INTRAVENOUS

## 2018-09-22 MED ORDER — LIDOCAINE HCL (PF) 2 % IJ SOLN
INTRAMUSCULAR | Status: DC | PRN
  Administered 2018-09-22: 100 mg via INTRADERMAL

## 2018-09-22 MED ORDER — ACETAMINOPHEN 500 MG PO TABS: 1000.0000 mg | ORAL_TABLET | Freq: Four times a day (QID) | ORAL | Status: DC | PRN

## 2018-09-22 MED ORDER — PROPOFOL 500 MG/50ML IV EMUL
INTRAVENOUS | Status: DC | PRN
  Administered 2018-09-22: 150 ug/kg/min via INTRAVENOUS

## 2018-09-22 NOTE — OR Nursing (Signed)
Dr T placed 3 bands on esophageal varices

## 2018-09-22 NOTE — Transfer of Care (Signed)
Immediate Anesthesia Transfer of Care Note  Patient: Dillon Graves  Procedure(s) Performed: ESOPHAGOGASTRODUODENOSCOPY (EGD) WITH PROPOFOL (N/A )  Patient Location: PACU  Anesthesia Type:General  Level of Consciousness: sedated  Airway & Oxygen Therapy: Patient Spontanous Breathing and Patient connected to nasal cannula oxygen  Post-op Assessment: Report given to RN and Post -op Vital signs reviewed and stable  Post vital signs: Reviewed and stable  Last Vitals:  Vitals Value Taken Time  BP 113/78 09/22/18 0959  Temp 36.1 C 09/22/18 0958  Pulse 58 09/22/18 1000  Resp 20 09/22/18 1000  SpO2 95 % 09/22/18 1000  Vitals shown include unvalidated device data.  Last Pain:  Vitals:   09/22/18 0958  TempSrc: Tympanic  PainSc:          Complications: No apparent anesthesia complications

## 2018-09-22 NOTE — Anesthesia Procedure Notes (Signed)
Date/Time: 09/22/2018 9:43 AM Performed by: Nelda Marseille, CRNA Pre-anesthesia Checklist: Patient identified, Emergency Drugs available, Suction available, Patient being monitored and Timeout performed Oxygen Delivery Method: Nasal cannula

## 2018-09-22 NOTE — Anesthesia Post-op Follow-up Note (Signed)
Anesthesia QCDR form completed.        

## 2018-09-22 NOTE — Anesthesia Preprocedure Evaluation (Signed)
Anesthesia Evaluation  Patient identified by MRN, date of birth, ID band Patient awake    Reviewed: Allergy & Precautions, NPO status , Patient's Chart, lab work & pertinent test results  History of Anesthesia Complications (+) PONV and history of anesthetic complications  Airway Mallampati: II  TM Distance: >3 FB Neck ROM: Full    Dental  (+) Poor Dentition   Pulmonary sleep apnea , neg COPD,    breath sounds clear to auscultation- rhonchi (-) wheezing      Cardiovascular hypertension, Pt. on medications (-) CAD, (-) Past MI, (-) Cardiac Stents and (-) CABG  Rhythm:Regular Rate:Normal - Systolic murmurs and - Diastolic murmurs    Neuro/Psych neg Seizures negative neurological ROS  negative psych ROS   GI/Hepatic Neg liver ROS, GERD  ,  Endo/Other  diabetes, Insulin Dependent  Renal/GU Renal InsufficiencyRenal disease     Musculoskeletal  (+) Arthritis ,   Abdominal (+) + obese,   Peds  Hematology  (+) anemia ,   Anesthesia Other Findings Past Medical History: No date: Anemia     Comment:  IDA No date: Arthritis No date: Carpal tunnel syndrome No date: Carpal tunnel syndrome     Comment:  RIGHT HAND No date: Chronic kidney disease     Comment:  STAGE 3 No date: Cirrhosis of liver without ascites (HCC) No date: Complication of anesthesia     Comment:  SLOW TO WAKE UP No date: Constipation No date: Diabetes mellitus without complication (HCC) No date: GERD (gastroesophageal reflux disease) No date: Gout No date: Gout No date: Hypercholesteremia No date: Hypertension No date: PONV (postoperative nausea and vomiting) No date: Sleep apnea No date: Splenomegaly No date: Thrombocytopenia ([place]) No date: Vertigo   Reproductive/Obstetrics                             Anesthesia Physical Anesthesia Plan  ASA: III  Anesthesia Plan: General   Post-op Pain Management:     Induction: Intravenous  PONV Risk Score and Plan: 2 and Propofol infusion  Airway Management Planned: Natural Airway  Additional Equipment:   Intra-op Plan:   Post-operative Plan:   Informed Consent: I have reviewed the patients History and Physical, chart, labs and discussed the procedure including the risks, benefits and alternatives for the proposed anesthesia with the patient or authorized representative who has indicated his/her understanding and acceptance.     Dental advisory given  Plan Discussed with: CRNA and Anesthesiologist  Anesthesia Plan Comments:         Anesthesia Quick Evaluation

## 2018-09-22 NOTE — Interval H&P Note (Signed)
History and Physical Interval Note:  09/22/2018 8:24 AM  Dillon Graves  has presented today for surgery, with the diagnosis of CIRRHOSIS,ESOPHAGEAL VARICES.  The various methods of treatment have been discussed with the patient and family. After consideration of risks, benefits and other options for treatment, the patient has consented to  Procedure(s): ESOPHAGOGASTRODUODENOSCOPY (EGD) WITH PROPOFOL (N/A) as a surgical intervention.  The patient's history has been reviewed, patient examined, no change in status, stable for surgery.  I have reviewed the patient's chart and labs.  Questions were answered to the patient's satisfaction.     [place], [place]

## 2018-09-22 NOTE — H&P (Signed)
Outpatient short stay form Pre-procedure 09/22/2018 8:22 AM  K. Dillon Fredricksonoledo, M.D.  Primary Physician: Horton ChinShamil Graves, M.D.  Reason for visit:  Bleeding esoophageal varices, cirrhosis  History of present illness:  Pleasant 68 y/o male with hx of cryptogenic cirrhosis and previous bleeding from esophageal varices presents for tertiary prevention of bleeding.   No current facility-administered medications for this encounter.   Medications Prior to Admission  Medication Sig Dispense Refill Last Dose  . atorvastatin (LIPITOR) 40 MG tablet Take 40 mg by mouth daily.     . Bromfenac Sodium (PROLENSA) 0.07 % SOLN Apply 1 drop to eye daily.     . cycloSPORINE (RESTASIS) 0.05 % ophthalmic emulsion Place 1 drop into both eyes 2 (two) times daily.     [name]. docusate sodium (COLACE) 100 MG capsule Take 100 mg by mouth 2 (two) times daily.     . empagliflozin (JARDIANCE) 25 MG TABS tablet Take 25 mg by mouth daily.     . Ferrous Fumarate (HEMOCYTE - 106 MG FE) 324 (106 Fe) MG TABS tablet Take 1 tablet by mouth daily.     . furosemide (LASIX) 80 MG tablet Take 80 mg by mouth daily.     [name]. lactulose (CHRONULAC) 10 GM/15ML solution Take 30 g by mouth 2 (two) times daily as needed for mild constipation.     . Multiple Vitamin (MULTIVITAMIN) tablet Take 1 tablet by mouth daily.     . nadolol (CORGARD) 40 MG tablet Take 40 mg by mouth daily.     . nitroGLYCERIN (NITROSTAT) 0.4 MG SL tablet Place 0.4 mg under the tongue every 5 (five) minutes as needed for chest pain.     . rifaximin (XIFAXAN) 550 MG TABS tablet Take 550 mg by mouth 2 (two) times daily.     [name]. aspirin 81 MG tablet Take 81 mg by mouth daily.     . ergocalciferol (VITAMIN D2) 50000 UNITS capsule Take 50,000 Units by mouth once a week.     . ezetimibe (ZETIA) 10 MG tablet Take 10 mg by mouth daily.     . febuxostat (ULORIC) 40 MG tablet Take 40 mg by mouth daily.     . insulin glargine (LANTUS) 100 UNIT/ML injection Inject 60 Units into the skin  at bedtime.     [name]. lisinopril-hydrochlorothiazide (PRINZIDE,ZESTORETIC) 20-25 MG tablet Take 1 tablet by mouth daily.     . meclizine (ANTIVERT) 25 MG tablet Take 1 tablet (25 mg total) by mouth 3 (three) times daily as needed. 30 tablet 0   . omeprazole (PRILOSEC) 40 MG capsule Take 40 mg by mouth daily.     . ondansetron (ZOFRAN ODT) 8 MG disintegrating tablet Take 1 tablet (8 mg total) by mouth 2 (two) times daily. 6 tablet 0   . sitaGLIPtin-metformin (JANUMET) 50-1000 MG tablet Take 1 tablet by mouth 2 (two) times daily with a meal.        Allergies  Allergen Reactions  . Morphine And Related Nausea And Vomiting     Past Medical History:  Diagnosis Date  . Anemia    IDA  . Arthritis   . Carpal tunnel syndrome   . Carpal tunnel syndrome    RIGHT HAND  . Chronic kidney disease    STAGE 3  . Cirrhosis of liver without ascites (HCC)   . Complication of anesthesia    SLOW TO WAKE UP  . Constipation   . Diabetes mellitus without complication (HCC)   . GERD (gastroesophageal reflux disease)   .  Gout   . Gout   . Hypercholesteremia   . Hypertension   . PONV (postoperative nausea and vomiting)   . Sleep apnea   . Splenomegaly   . Thrombocytopenia ([place])   . Vertigo     Review of systems:  Otherwise negative.    Physical Exam  Gen: Alert, oriented. Appears stated age.  HEENT: [place]/AT. PERRLA. Lungs: CTA, no wheezes. CV: RR nl S1, S2. Abd: soft, benign, no masses. BS+ Ext: No edema. Pulses 2+    Planned procedures: Proceed with EGD. The patient understands the nature of the planned procedure, indications, risks, alternatives and potential complications including but not limited to bleeding, infection, perforation, damage to internal organs and possible oversedation/side effects from anesthesia. The patient agrees and gives consent to proceed.  Please refer to procedure notes for findings, recommendations and patient disposition/instructions.      Dillon Graves,  M.D. Gastroenterology 09/22/2018  8:22 AM

## 2018-09-22 NOTE — Anesthesia Postprocedure Evaluation (Signed)
Anesthesia Post Note  Patient: Dillon Graves  Procedure(s) Performed: ESOPHAGOGASTRODUODENOSCOPY (EGD) WITH PROPOFOL (N/A )  Patient location during evaluation: Endoscopy Anesthesia Type: General Level of consciousness: awake and alert and oriented Pain management: pain level controlled Vital Signs Assessment: post-procedure vital signs reviewed and stable Respiratory status: spontaneous breathing, nonlabored ventilation and respiratory function stable Cardiovascular status: blood pressure returned to baseline and stable Postop Assessment: no signs of nausea or vomiting Anesthetic complications: no     Last Vitals:  Vitals:   09/22/18 1018 09/22/18 1038  BP: (!) 108/58 (!) 115/56  Pulse: (!) 53 (!) 44  Resp: 13 17  Temp:    SpO2: 99% 100%    Last Pain:  Vitals:   09/22/18 0958  TempSrc: Tympanic  PainSc:                  Remee Charley

## 2018-09-22 NOTE — Op Note (Signed)
[facility] Gastroenterology Patient Name: Dillon Graves Procedure Date: 09/22/2018 8:42 AM MRN: [id number] Account #: [id number] Date of Birth: 1950/11/23 Admit Type: Outpatient Age: 68 Room: [facility] ENDO ROOM 3 Gender: Male Note Status: Finalized Procedure:            Upper GI endoscopy Indications:          2nd degree variceal eradication (following bleed) Providers:            Boykin Nearing. Luberta Grabinski MD, MD Medicines:            Propofol per Anesthesia Complications:        No immediate complications. Procedure:            Pre-Anesthesia Assessment:                       - The risks and benefits of the procedure and the                        sedation options and risks were discussed with the                        patient. All questions were answered and informed                        consent was obtained.                       - Patient identification and proposed procedure were                        verified prior to the procedure by the nurse. The                        procedure was verified in the procedure room.                       - ASA Grade Assessment: III - A patient with severe                        systemic disease.                       - After reviewing the risks and benefits, the patient                        was deemed in satisfactory condition to undergo the                        procedure.                       After obtaining informed consent, the endoscope was                        passed under direct vision. Throughout the procedure,                        the patient's blood pressure, pulse, and oxygen                        saturations were monitored continuously. The Endoscope  was introduced through the mouth, and advanced to the                        third part of duodenum. The upper GI endoscopy was                        accomplished without difficulty. The patient tolerated                        the  procedure well. Findings:      Grade III varices were found in the lower third of the esophagus. They       were 8 mm in largest diameter. Three bands were successfully placed with       complete eradication, resulting in deflation of varices. There was no       bleeding during and at the end of the procedure. Estimated blood loss:       none.      Severe portal hypertensive gastropathy was found in the entire examined       stomach.      Diffuse moderately erythematous mucosa without active bleeding and with       no stigmata of bleeding was found in the entire duodenum.      There is no endoscopic evidence of varices in the entire examined       stomach. Impression:           - Grade III esophageal varices. Completely eradicated.                        Banded.                       - Portal hypertensive gastropathy.                       - Erythematous duodenopathy.                       - No specimens collected. Recommendation:       - Patient has a contact number available for                        emergencies. The signs and symptoms of potential                        delayed complications were discussed with the patient.                        Return to normal activities tomorrow. Written discharge                        instructions were provided to the patient.                       - Resume previous diet.                       - Continue present medications.                       - Repeat upper endoscopy in 1 month for endoscopic band  ligation.                       - Return to GI office in 2 weeks.                       - The findings and recommendations were discussed with                        the patient. Procedure Code(s):    --- Professional ---                       [phone], Esophagogastroduodenoscopy, flexible, transoral;                        with band ligation of esophageal/gastric varices Diagnosis Code(s):    --- Professional ---                        K31.89, Other diseases of stomach and duodenum                       K76.6, Portal hypertension                       I85.00, Esophageal varices without bleeding CPT copyright 2019 American Medical Association. All rights reserved. The codes documented in this report are preliminary and upon coder review may  be revised to meet current compliance requirements. Efrain Sella MD, MD 09/22/2018 9:59:26 AM This report has been signed electronically. Number of Addenda: 0 Note Initiated On: 09/22/2018 8:42 AM Estimated Blood Loss: Estimated blood loss: none.      [facility]

## 2018-09-23 ENCOUNTER — Encounter: Payer: Self-pay | Admitting: Internal Medicine

## 2018-10-01 ENCOUNTER — Other Ambulatory Visit: Payer: Self-pay | Admitting: Gastroenterology

## 2018-10-01 DIAGNOSIS — K7469 Other cirrhosis of liver: Secondary | ICD-10-CM

## 2018-10-05 ENCOUNTER — Ambulatory Visit
Admission: RE | Admit: 2018-10-05 | Discharge: 2018-10-05 | Disposition: A | Discharge status: Home / Self Care | Payer: Commercial Managed Care - PPO | Source: Ambulatory Visit | Attending: Gastroenterology | Admitting: Gastroenterology

## 2018-10-05 ENCOUNTER — Other Ambulatory Visit: Payer: Self-pay

## 2018-10-05 DIAGNOSIS — K7469 Other cirrhosis of liver: Secondary | ICD-10-CM | POA: Diagnosis not present

## 2018-10-15 ENCOUNTER — Other Ambulatory Visit
Admission: RE | Admit: 2018-10-15 | Discharge: 2018-10-15 | Disposition: A | Discharge status: Home / Self Care | Payer: Commercial Managed Care - PPO | Source: Ambulatory Visit | Attending: Internal Medicine | Admitting: Internal Medicine

## 2018-10-15 DIAGNOSIS — Z01812 Encounter for preprocedural laboratory examination: Secondary | ICD-10-CM | POA: Diagnosis not present

## 2018-10-15 DIAGNOSIS — Z20828 Contact with and (suspected) exposure to other viral communicable diseases: Secondary | ICD-10-CM | POA: Diagnosis not present

## 2018-10-15 LAB — SARS CORONAVIRUS 2 (TAT 6-24 HRS): SARS Coronavirus 2: NEGATIVE

## 2018-10-19 ENCOUNTER — Encounter: Payer: Self-pay | Admitting: *Deleted

## 2018-10-20 ENCOUNTER — Encounter: Payer: Self-pay | Admitting: *Deleted

## 2018-10-20 ENCOUNTER — Encounter
Admission: RE | Disposition: A | Discharge status: Home / Self Care | Payer: Self-pay | Source: Home / Self Care | Attending: Internal Medicine

## 2018-10-20 ENCOUNTER — Ambulatory Visit: Payer: Commercial Managed Care - PPO | Admitting: Certified Registered"

## 2018-10-20 ENCOUNTER — Ambulatory Visit
Admission: RE | Admit: 2018-10-20 | Discharge: 2018-10-20 | Disposition: A | Discharge status: Home / Self Care | Payer: Commercial Managed Care - PPO | Attending: Internal Medicine | Admitting: Internal Medicine

## 2018-10-20 ENCOUNTER — Other Ambulatory Visit: Payer: Self-pay

## 2018-10-20 DIAGNOSIS — I129 Hypertensive chronic kidney disease with stage 1 through stage 4 chronic kidney disease, or unspecified chronic kidney disease: Secondary | ICD-10-CM | POA: Insufficient documentation

## 2018-10-20 DIAGNOSIS — D631 Anemia in chronic kidney disease: Secondary | ICD-10-CM | POA: Insufficient documentation

## 2018-10-20 DIAGNOSIS — N189 Chronic kidney disease, unspecified: Secondary | ICD-10-CM | POA: Diagnosis not present

## 2018-10-20 DIAGNOSIS — Z79899 Other long term (current) drug therapy: Secondary | ICD-10-CM | POA: Insufficient documentation

## 2018-10-20 DIAGNOSIS — Z794 Long term (current) use of insulin: Secondary | ICD-10-CM | POA: Diagnosis not present

## 2018-10-20 DIAGNOSIS — K766 Portal hypertension: Secondary | ICD-10-CM | POA: Diagnosis not present

## 2018-10-20 DIAGNOSIS — I8510 Secondary esophageal varices without bleeding: Secondary | ICD-10-CM | POA: Diagnosis not present

## 2018-10-20 DIAGNOSIS — E7800 Pure hypercholesterolemia, unspecified: Secondary | ICD-10-CM | POA: Diagnosis not present

## 2018-10-20 DIAGNOSIS — K219 Gastro-esophageal reflux disease without esophagitis: Secondary | ICD-10-CM | POA: Insufficient documentation

## 2018-10-20 DIAGNOSIS — K3189 Other diseases of stomach and duodenum: Secondary | ICD-10-CM | POA: Insufficient documentation

## 2018-10-20 DIAGNOSIS — G4730 Sleep apnea, unspecified: Secondary | ICD-10-CM | POA: Diagnosis not present

## 2018-10-20 DIAGNOSIS — Z7982 Long term (current) use of aspirin: Secondary | ICD-10-CM | POA: Diagnosis not present

## 2018-10-20 DIAGNOSIS — Z885 Allergy status to narcotic agent status: Secondary | ICD-10-CM | POA: Insufficient documentation

## 2018-10-20 DIAGNOSIS — E1122 Type 2 diabetes mellitus with diabetic chronic kidney disease: Secondary | ICD-10-CM | POA: Diagnosis not present

## 2018-10-20 DIAGNOSIS — M1990 Unspecified osteoarthritis, unspecified site: Secondary | ICD-10-CM | POA: Diagnosis not present

## 2018-10-20 DIAGNOSIS — K7469 Other cirrhosis of liver: Secondary | ICD-10-CM | POA: Insufficient documentation

## 2018-10-20 HISTORY — PX: ESOPHAGOGASTRODUODENOSCOPY (EGD) WITH PROPOFOL: SHX5813

## 2018-10-20 LAB — GLUCOSE, CAPILLARY: Glucose-Capillary: 110 mg/dL — ABNORMAL HIGH (ref 70–99)

## 2018-10-20 SURGERY — ESOPHAGOGASTRODUODENOSCOPY (EGD) WITH PROPOFOL
Anesthesia: General

## 2018-10-20 MED ORDER — PROPOFOL 10 MG/ML IV BOLUS
INTRAVENOUS | Status: DC | PRN
  Administered 2018-10-20: 20 mg via INTRAVENOUS
  Administered 2018-10-20: 50 mg via INTRAVENOUS
  Administered 2018-10-20: 30 mg via INTRAVENOUS

## 2018-10-20 MED ORDER — LIDOCAINE HCL (CARDIAC) PF 100 MG/5ML IV SOSY
PREFILLED_SYRINGE | INTRAVENOUS | Status: DC | PRN
  Administered 2018-10-20: 100 mg via INTRATRACHEAL

## 2018-10-20 MED ORDER — SODIUM CHLORIDE 0.9 % IV SOLN
INTRAVENOUS | Status: DC
  Administered 2018-10-20: 11:00:00 via INTRAVENOUS
  Administered 2018-10-20: 10:00:00 1000 mL via INTRAVENOUS

## 2018-10-20 MED ORDER — PHENYLEPHRINE HCL (PRESSORS) 10 MG/ML IV SOLN
INTRAVENOUS | Status: DC | PRN
  Administered 2018-10-20: 100 ug via INTRAVENOUS

## 2018-10-20 MED ORDER — PROPOFOL 500 MG/50ML IV EMUL
INTRAVENOUS | Status: DC | PRN
  Administered 2018-10-20: 150 ug/kg/min via INTRAVENOUS

## 2018-10-20 MED ORDER — GLYCOPYRROLATE 0.2 MG/ML IJ SOLN
INTRAMUSCULAR | Status: DC | PRN
  Administered 2018-10-20 (×2): 0.2 mg via INTRAVENOUS

## 2018-10-20 NOTE — Transfer of Care (Signed)
Immediate Anesthesia Transfer of Care Note  Patient: Dillon Graves  Procedure(s) Performed: ESOPHAGOGASTRODUODENOSCOPY (EGD) WITH PROPOFOL (N/A )  Patient Location: Endoscopy Unit  Anesthesia Type:General  Level of Consciousness: drowsy and responds to stimulation  Airway & Oxygen Therapy: Patient Spontanous Breathing and Patient connected to face mask oxygen  Post-op Assessment: Report given to RN and Post -op Vital signs reviewed and stable  Post vital signs: Reviewed and stable  Last Vitals:  Vitals Value Taken Time  BP 116/79 10/20/18 1129  Temp    Pulse 85 10/20/18 1129  Resp 20 10/20/18 1129  SpO2 98 % 10/20/18 1129  Vitals shown include unvalidated device data.  Last Pain:  Vitals:   10/20/18 1000  TempSrc: Tympanic  PainSc: 0-No pain         Complications: No apparent anesthesia complications

## 2018-10-20 NOTE — Anesthesia Postprocedure Evaluation (Signed)
Anesthesia Post Note  Patient: Dillon Graves  Procedure(s) Performed: ESOPHAGOGASTRODUODENOSCOPY (EGD) WITH PROPOFOL (N/A )  Patient location during evaluation: Endoscopy Anesthesia Type: General Level of consciousness: awake and alert Pain management: pain level controlled Vital Signs Assessment: post-procedure vital signs reviewed and stable Respiratory status: spontaneous breathing and respiratory function stable Cardiovascular status: stable Anesthetic complications: no     Last Vitals:  Vitals:   10/20/18 1150 10/20/18 1159  BP: 139/85 134/89  Pulse: 82   Resp: 19   Temp:    SpO2: 97%     Last Pain:  Vitals:   10/20/18 1159  TempSrc:   PainSc: 0-No pain                 KEPHART,WILLIAM K

## 2018-10-20 NOTE — Interval H&P Note (Signed)
History and Physical Interval Note:  10/20/2018 10:34 AM  Dillon Graves  has presented today for surgery, with the diagnosis of VARICES CRYPTOGENIC CIRRHOSIS.  The various methods of treatment have been discussed with the patient and family. After consideration of risks, benefits and other options for treatment, the patient has consented to  Procedure(s): ESOPHAGOGASTRODUODENOSCOPY (EGD) WITH PROPOFOL (N/A) as a surgical intervention.  The patient's history has been reviewed, patient examined, no change in status, stable for surgery.  I have reviewed the patient's chart and labs.  Questions were answered to the patient's satisfaction.     [place], [place]

## 2018-10-20 NOTE — H&P (Signed)
Outpatient short stay form Pre-procedure 10/20/2018 10:32 AM Dashay Giesler K. [name], M.D.  Primary Physician: Horton Chin, M.D.  Reason for visit:  Portal hypertensions, esophageal varices  History of present illness: 68 year old male with a history of cryptogenic cirrhosis with previous esophageal variceal bleeding presents for follow-up variceal banding for variceal obliteration.  Patient denies any recent episodes of melena, hematemesis, abdominal pain, dizziness or weakness.    Current Facility-Administered Medications:  .  0.9 %  sodium chloride infusion, , Intravenous, Continuous, [place], Boykin Nearing, MD, Last Rate: 20 mL/hr at 10/20/18 1013, 1,000 mL at 10/20/18 1013  Medications Prior to Admission  Medication Sig Dispense Refill Last Dose  . atorvastatin (LIPITOR) 40 MG tablet Take 40 mg by mouth daily.   10/19/2018 at Unknown time  . docusate sodium (COLACE) 100 MG capsule Take 100 mg by mouth 2 (two) times daily.   10/19/2018 at Unknown time  . empagliflozin (JARDIANCE) 25 MG TABS tablet Take 25 mg by mouth daily.   10/19/2018 at Unknown time  . ergocalciferol (VITAMIN D2) 50000 UNITS capsule Take 50,000 Units by mouth once a week.   10/19/2018 at Unknown time  . ezetimibe (ZETIA) 10 MG tablet Take 10 mg by mouth daily.   10/19/2018 at Unknown time  . febuxostat (ULORIC) 40 MG tablet Take 40 mg by mouth daily.   10/19/2018 at Unknown time  . furosemide (LASIX) 80 MG tablet Take 80 mg by mouth daily.   10/19/2018 at Unknown time  . insulin glargine (LANTUS) 100 UNIT/ML injection Inject 60 Units into the skin at bedtime.   10/19/2018 at Unknown time  . lactulose (CHRONULAC) 10 GM/15ML solution Take 30 g by mouth 2 (two) times daily as needed for mild constipation.   10/19/2018 at Unknown time  . lisinopril-hydrochlorothiazide (PRINZIDE,ZESTORETIC) 20-25 MG tablet Take 1 tablet by mouth daily.   10/19/2018 at Unknown time  . nadolol (CORGARD) 40 MG tablet Take 40 mg by mouth daily.    10/19/2018 at Unknown time  . omeprazole (PRILOSEC) 40 MG capsule Take 40 mg by mouth daily.   10/19/2018 at Unknown time  . rifaximin (XIFAXAN) 550 MG TABS tablet Take 550 mg by mouth 2 (two) times daily.   10/19/2018 at Unknown time  . sitaGLIPtin-metformin (JANUMET) 50-1000 MG tablet Take 1 tablet by mouth 2 (two) times daily with a meal.   10/19/2018 at Unknown time  . aspirin 81 MG tablet Take 81 mg by mouth daily.   10/17/2018  . Bromfenac Sodium (PROLENSA) 0.07 % SOLN Apply 1 drop to eye daily.     . cycloSPORINE (RESTASIS) 0.05 % ophthalmic emulsion Place 1 drop into both eyes 2 (two) times daily.     . Ferrous Fumarate (HEMOCYTE - 106 MG FE) 324 (106 Fe) MG TABS tablet Take 1 tablet by mouth daily.   10/17/2018  . Multiple Vitamin (MULTIVITAMIN) tablet Take 1 tablet by mouth daily.   10/17/2018  . nitroGLYCERIN (NITROSTAT) 0.4 MG SL tablet Place 0.4 mg under the tongue every 5 (five) minutes as needed for chest pain.     [name] ondansetron (ZOFRAN ODT) 8 MG disintegrating tablet Take 1 tablet (8 mg total) by mouth 2 (two) times daily. 6 tablet 0      Allergies  Allergen Reactions  . Morphine And Related Nausea And Vomiting     Past Medical History:  Diagnosis Date  . Anemia    IDA  . Arthritis   . Carpal tunnel syndrome   . Carpal tunnel syndrome  RIGHT HAND  . Chronic kidney disease    STAGE 3  . Cirrhosis of liver without ascites ([place])   . Complication of anesthesia    SLOW TO WAKE UP  . Constipation   . Diabetes mellitus without complication ([place])   . GERD (gastroesophageal reflux disease)   . Gout   . Gout   . Hypercholesteremia   . Hypertension   . PONV (postoperative nausea and vomiting)   . Sleep apnea   . Splenomegaly   . Thrombocytopenia ([place])   . Vertigo     Review of systems:  Otherwise negative.    Physical Exam  Gen: Alert, oriented. Appears stated age.  HEENT: [place]/AT. PERRLA. Lungs: CTA, no wheezes. CV: RR nl S1, S2. Abd: soft, benign, no  masses. BS+ Ext: No edema. Pulses 2+    Planned procedures: Proceed with EGD with possible esophageal variceal banding. The patient understands the nature of the planned procedure, indications, risks, alternatives and potential complications including but not limited to bleeding, infection, perforation, damage to internal organs and possible oversedation/side effects from anesthesia. The patient agrees and gives consent to proceed.  Please refer to procedure notes for findings, recommendations and patient disposition/instructions.     Janissa Bertram K. [name], M.D. Gastroenterology 10/20/2018  10:32 AM

## 2018-10-20 NOTE — Op Note (Signed)
[facility] Gastroenterology Patient Name: Seward Carolathan Aull Procedure Date: 10/20/2018 11:07 AM MRN: [id number] Account #: [id number] Date of Birth: 06/13/1950 Admit Type: Outpatient Age: 68 Room: [facility] ENDO ROOM 3 Gender: Male Note Status: Finalized Procedure:            Upper GI endoscopy Indications:          Portal hypertensive gastropathy, Esophageal varices Providers:            Boykin Nearingeodoro K. Norma Fredricksonoledo MD, MD Referring MD:         Alan MulderShamil J. Morayati, MD (Referring MD) Medicines:            Propofol per Anesthesia Complications:        No immediate complications. Procedure:            Pre-Anesthesia Assessment:                       - The risks and benefits of the procedure and the                        sedation options and risks were discussed with the                        patient. All questions were answered and informed                        consent was obtained.                       - Patient identification and proposed procedure were                        verified prior to the procedure by the nurse. The                        procedure was verified in the procedure room.                       - ASA Grade Assessment: III - A patient with severe                        systemic disease.                       - After reviewing the risks and benefits, the patient                        was deemed in satisfactory condition to undergo the                        procedure.                       After obtaining informed consent, the endoscope was                        passed under direct vision. Throughout the procedure,                        the patient's blood pressure, pulse, and oxygen  saturations were monitored continuously. The Endoscope                        was introduced through the mouth, and advanced to the                        third part of duodenum. The upper GI endoscopy was                        accomplished without  difficulty. The patient tolerated                        the procedure well. Findings:      Grade II varices were found in the lower third of the esophagus. They       were 6 mm in largest diameter. Three bands were successfully placed with       complete eradication, resulting in deflation of varices. There was no       bleeding during and at the end of the procedure. Estimated blood loss:       none.      Severe portal hypertensive gastropathy was found in the entire examined       stomach.      There is no endoscopic evidence of varices in the entire examined       stomach.      The examined duodenum was normal.      The exam was otherwise without abnormality. Impression:           - Grade II esophageal varices. Completely eradicated.                        Banded.                       - Portal hypertensive gastropathy.                       - Normal examined duodenum.                       - The examination was otherwise normal.                       - No specimens collected. Recommendation:       - Patient has a contact number available for                        emergencies. The signs and symptoms of potential                        delayed complications were discussed with the patient.                        Return to normal activities tomorrow. Written discharge                        instructions were provided to the patient.                       - Resume previous diet.                       -  Continue present medications.                       - Repeat upper endoscopy in 3 months for endoscopic                        band ligation.                       - Return to GI office in 2 months.                       - The findings and recommendations were discussed with                        the patient. Procedure Code(s):    --- Professional ---                       [phone], Esophagogastroduodenoscopy, flexible, transoral;                        with band ligation of  esophageal/gastric varices Diagnosis Code(s):    --- Professional ---                       K31.89, Other diseases of stomach and duodenum                       K76.6, Portal hypertension                       I85.00, Esophageal varices without bleeding CPT copyright 2019 American [employer] Association. All rights reserved. The codes documented in this report are preliminary and upon coder review may  be revised to meet current compliance requirements. Efrain Sella MD, MD 10/20/2018 11:30:45 AM This report has been signed electronically. Number of Addenda: 0 Note Initiated On: 10/20/2018 11:07 AM Estimated Blood Loss: Estimated blood loss: none.      [facility]

## 2018-10-20 NOTE — Anesthesia Post-op Follow-up Note (Signed)
Anesthesia QCDR form completed.        

## 2018-10-20 NOTE — Anesthesia Preprocedure Evaluation (Signed)
Anesthesia Evaluation  Patient identified by MRN, date of birth, ID band Patient awake    Reviewed: Allergy & Precautions, NPO status , Patient's Chart, lab work & pertinent test results  History of Anesthesia Complications (+) PONV and history of anesthetic complications (PONV with morphine)  Airway Mallampati: II       Dental   Pulmonary sleep apnea and Continuous Positive Airway Pressure Ventilation , neg COPD, Not current smoker,           Cardiovascular hypertension, Pt. on medications (-) Past MI and (-) CHF (-) dysrhythmias (-) Valvular Problems/Murmurs     Neuro/Psych neg Seizures    GI/Hepatic GERD  Medicated,(+) Cirrhosis   Esophageal Varices    ,   Endo/Other  diabetes, Type 2, Oral Hypoglycemic Agents  Renal/GU Renal InsufficiencyRenal disease     Musculoskeletal   Abdominal   Peds  Hematology  (+) anemia ,   Anesthesia Other Findings   Reproductive/Obstetrics                             Anesthesia Physical Anesthesia Plan  ASA: III  Anesthesia Plan: General   Post-op Pain Management:    Induction: Intravenous  PONV Risk Score and Plan: 3 and Propofol infusion, TIVA and Ondansetron  Airway Management Planned: Nasal Cannula  Additional Equipment:   Intra-op Plan:   Post-operative Plan:   Informed Consent: I have reviewed the patients History and Physical, chart, labs and discussed the procedure including the risks, benefits and alternatives for the proposed anesthesia with the patient or authorized representative who has indicated his/her understanding and acceptance.       Plan Discussed with:   Anesthesia Plan Comments:         Anesthesia Quick Evaluation

## 2018-10-21 ENCOUNTER — Encounter: Payer: Self-pay | Admitting: Internal Medicine

## 2018-12-27 ENCOUNTER — Other Ambulatory Visit
Admission: RE | Admit: 2018-12-27 | Discharge: 2018-12-27 | Disposition: A | Discharge status: Home / Self Care | Payer: Commercial Managed Care - PPO | Source: Ambulatory Visit | Attending: Internal Medicine | Admitting: Internal Medicine

## 2018-12-27 DIAGNOSIS — K7469 Other cirrhosis of liver: Secondary | ICD-10-CM | POA: Diagnosis not present

## 2018-12-27 LAB — AMMONIA: Ammonia: 78 umol/L — ABNORMAL HIGH (ref 9–35)

## 2019-02-18 ENCOUNTER — Other Ambulatory Visit: Payer: Self-pay

## 2019-02-18 ENCOUNTER — Other Ambulatory Visit
Admission: RE | Admit: 2019-02-18 | Discharge: 2019-02-18 | Disposition: A | Discharge status: Home / Self Care | Payer: Commercial Managed Care - PPO | Source: Ambulatory Visit | Attending: Internal Medicine | Admitting: Internal Medicine

## 2019-02-18 DIAGNOSIS — Z01812 Encounter for preprocedural laboratory examination: Secondary | ICD-10-CM | POA: Diagnosis present

## 2019-02-18 DIAGNOSIS — Z20822 Contact with and (suspected) exposure to covid-19: Secondary | ICD-10-CM | POA: Insufficient documentation

## 2019-02-19 LAB — SARS CORONAVIRUS 2 (TAT 6-24 HRS): SARS Coronavirus 2: NEGATIVE

## 2019-02-21 ENCOUNTER — Other Ambulatory Visit: Payer: Commercial Managed Care - PPO

## 2019-02-22 ENCOUNTER — Encounter: Payer: Self-pay | Admitting: Internal Medicine

## 2019-02-23 ENCOUNTER — Ambulatory Visit
Admission: RE | Admit: 2019-02-23 | Discharge: 2019-02-23 | Disposition: A | Discharge status: Home / Self Care | Payer: Commercial Managed Care - PPO | Attending: Internal Medicine | Admitting: Internal Medicine

## 2019-02-23 ENCOUNTER — Ambulatory Visit: Payer: Commercial Managed Care - PPO | Admitting: Anesthesiology

## 2019-02-23 ENCOUNTER — Other Ambulatory Visit: Payer: Self-pay

## 2019-02-23 ENCOUNTER — Encounter
Admission: RE | Disposition: A | Discharge status: Home / Self Care | Payer: Self-pay | Source: Home / Self Care | Attending: Internal Medicine

## 2019-02-23 DIAGNOSIS — Z794 Long term (current) use of insulin: Secondary | ICD-10-CM | POA: Diagnosis not present

## 2019-02-23 DIAGNOSIS — K3189 Other diseases of stomach and duodenum: Secondary | ICD-10-CM | POA: Insufficient documentation

## 2019-02-23 DIAGNOSIS — Z96642 Presence of left artificial hip joint: Secondary | ICD-10-CM | POA: Insufficient documentation

## 2019-02-23 DIAGNOSIS — D631 Anemia in chronic kidney disease: Secondary | ICD-10-CM | POA: Insufficient documentation

## 2019-02-23 DIAGNOSIS — Z9841 Cataract extraction status, right eye: Secondary | ICD-10-CM | POA: Insufficient documentation

## 2019-02-23 DIAGNOSIS — E7800 Pure hypercholesterolemia, unspecified: Secondary | ICD-10-CM | POA: Diagnosis not present

## 2019-02-23 DIAGNOSIS — I8510 Secondary esophageal varices without bleeding: Secondary | ICD-10-CM | POA: Diagnosis not present

## 2019-02-23 DIAGNOSIS — Z885 Allergy status to narcotic agent status: Secondary | ICD-10-CM | POA: Insufficient documentation

## 2019-02-23 DIAGNOSIS — Z7982 Long term (current) use of aspirin: Secondary | ICD-10-CM | POA: Diagnosis not present

## 2019-02-23 DIAGNOSIS — I129 Hypertensive chronic kidney disease with stage 1 through stage 4 chronic kidney disease, or unspecified chronic kidney disease: Secondary | ICD-10-CM | POA: Diagnosis not present

## 2019-02-23 DIAGNOSIS — K7460 Unspecified cirrhosis of liver: Secondary | ICD-10-CM | POA: Insufficient documentation

## 2019-02-23 DIAGNOSIS — N183 Chronic kidney disease, stage 3 unspecified: Secondary | ICD-10-CM | POA: Diagnosis not present

## 2019-02-23 DIAGNOSIS — Z79899 Other long term (current) drug therapy: Secondary | ICD-10-CM | POA: Insufficient documentation

## 2019-02-23 DIAGNOSIS — K766 Portal hypertension: Secondary | ICD-10-CM | POA: Diagnosis not present

## 2019-02-23 DIAGNOSIS — D696 Thrombocytopenia, unspecified: Secondary | ICD-10-CM | POA: Insufficient documentation

## 2019-02-23 DIAGNOSIS — K219 Gastro-esophageal reflux disease without esophagitis: Secondary | ICD-10-CM | POA: Insufficient documentation

## 2019-02-23 DIAGNOSIS — M1990 Unspecified osteoarthritis, unspecified site: Secondary | ICD-10-CM | POA: Insufficient documentation

## 2019-02-23 DIAGNOSIS — I8500 Esophageal varices without bleeding: Secondary | ICD-10-CM | POA: Diagnosis present

## 2019-02-23 DIAGNOSIS — Z9049 Acquired absence of other specified parts of digestive tract: Secondary | ICD-10-CM | POA: Diagnosis not present

## 2019-02-23 DIAGNOSIS — Z9842 Cataract extraction status, left eye: Secondary | ICD-10-CM | POA: Insufficient documentation

## 2019-02-23 DIAGNOSIS — E1122 Type 2 diabetes mellitus with diabetic chronic kidney disease: Secondary | ICD-10-CM | POA: Insufficient documentation

## 2019-02-23 DIAGNOSIS — K5900 Constipation, unspecified: Secondary | ICD-10-CM | POA: Diagnosis not present

## 2019-02-23 DIAGNOSIS — Z96651 Presence of right artificial knee joint: Secondary | ICD-10-CM | POA: Insufficient documentation

## 2019-02-23 DIAGNOSIS — G4730 Sleep apnea, unspecified: Secondary | ICD-10-CM | POA: Insufficient documentation

## 2019-02-23 DIAGNOSIS — Z961 Presence of intraocular lens: Secondary | ICD-10-CM | POA: Diagnosis not present

## 2019-02-23 DIAGNOSIS — R161 Splenomegaly, not elsewhere classified: Secondary | ICD-10-CM | POA: Diagnosis not present

## 2019-02-23 DIAGNOSIS — K439 Ventral hernia without obstruction or gangrene: Secondary | ICD-10-CM | POA: Insufficient documentation

## 2019-02-23 HISTORY — DX: Unspecified lack of coordination: R27.9

## 2019-02-23 HISTORY — DX: Other bursitis of hip, unspecified hip: M70.70

## 2019-02-23 HISTORY — DX: Portal hypertension: K76.6

## 2019-02-23 HISTORY — DX: Muscle weakness (generalized): M62.81

## 2019-02-23 HISTORY — DX: Nausea with vomiting, unspecified: R11.2

## 2019-02-23 HISTORY — DX: Unspecified osteoarthritis, unspecified site: M19.90

## 2019-02-23 HISTORY — DX: Other specified postprocedural states: Z98.890

## 2019-02-23 HISTORY — PX: ESOPHAGOGASTRODUODENOSCOPY (EGD) WITH PROPOFOL: SHX5813

## 2019-02-23 HISTORY — DX: Other complications of anesthesia, initial encounter: T88.59XA

## 2019-02-23 HISTORY — DX: Shortness of breath: R06.02

## 2019-02-23 HISTORY — DX: Edema, unspecified: R60.9

## 2019-02-23 LAB — GLUCOSE, CAPILLARY: Glucose-Capillary: 90 mg/dL (ref 70–99)

## 2019-02-23 SURGERY — ESOPHAGOGASTRODUODENOSCOPY (EGD) WITH PROPOFOL
Anesthesia: General

## 2019-02-23 MED ORDER — PROPOFOL 500 MG/50ML IV EMUL
INTRAVENOUS | Status: AC
  Filled 2019-02-23: qty 50

## 2019-02-23 MED ORDER — SODIUM CHLORIDE 0.9 % IV SOLN
INTRAVENOUS | Status: DC
  Administered 2019-02-23: 1000 mL via INTRAVENOUS

## 2019-02-23 MED ORDER — PROPOFOL 500 MG/50ML IV EMUL
INTRAVENOUS | Status: DC | PRN
  Administered 2019-02-23: 165 ug/kg/min via INTRAVENOUS

## 2019-02-23 NOTE — Op Note (Signed)
[facility] Gastroenterology Patient Name: Dillon Graves Procedure Date: 02/23/2019 7:22 AM MRN: [id number] Account #: [id number] Date of Birth: 1950-11-16 Admit Type: Outpatient Age: 69 Room: [facility] ENDO ROOM 3 Gender: Male Note Status: Finalized Procedure:             Upper GI endoscopy Indications:           Follow-up of esophageal varices Providers:             Royce Macadamia K. [name] MD, MD Referring MD:          Alan Mulder, MD (Referring MD) Medicines:             Propofol per Anesthesia Complications:         No immediate complications. Procedure:             Pre-Anesthesia Assessment:                        - The risks and benefits of the procedure and the                         sedation options and risks were discussed with the                         patient. All questions were answered and informed                         consent was obtained.                        - Patient identification and proposed procedure were                         verified prior to the procedure by the nurse. The                         procedure was verified in the procedure room.                        - ASA Grade Assessment: III - A patient with severe                         systemic disease.                        - After reviewing the risks and benefits, the patient                         was deemed in satisfactory condition to undergo the                         procedure.                        After obtaining informed consent, the endoscope was                         passed under direct vision. Throughout the procedure,                         the patient's blood  pressure, pulse, and oxygen                         saturations were monitored continuously. The Endoscope                         was introduced through the mouth, and advanced to the                         third part of duodenum. The upper GI endoscopy was                         accomplished without  difficulty. The patient tolerated                         the procedure well. Findings:      Grade I varices were found in the middle third of the esophagus and in       the lower third of the esophagus. They were 4 mm in largest diameter. NO       stigmata of bleeding and varices very small, so banding was not       undertaken. Estimated blood loss: none.      Severe portal hypertensive gastropathy was found in the entire examined       stomach.      The examined duodenum was normal.      The exam was otherwise without abnormality. Impression:            - Grade I esophageal varices.                        - Portal hypertensive gastropathy.                        - Normal examined duodenum.                        - The examination was otherwise normal.                        - No specimens collected. Recommendation:        - Patient has a contact number available for                         emergencies. The signs and symptoms of potential                         delayed complications were discussed with the patient.                         Return to normal activities tomorrow. Written                         discharge instructions were provided to the patient.                        - Resume previous diet.                        - Continue present medications.                        -  Repeat upper endoscopy in 1 year for surveillance.                        - Return to GI office in 3 months.                        - The findings and recommendations were discussed with                         the patient. Procedure Code(s):     --- Professional ---                        [phone], Esophagogastroduodenoscopy, flexible,                         transoral; diagnostic, including collection of                         specimen(s) by brushing or washing, when performed                         (separate procedure) Diagnosis Code(s):     --- Professional ---                        K31.89, Other  diseases of stomach and duodenum                        K76.6, Portal hypertension                        I85.00, Esophageal varices without bleeding CPT copyright 2019 American Medical Association. All rights reserved. The codes documented in this report are preliminary and upon coder review may  be revised to meet current compliance requirements. Stanton Kidney MD, MD 02/23/2019 8:37:23 AM This report has been signed electronically. Number of Addenda: 0 Note Initiated On: 02/23/2019 7:22 AM Estimated Blood Loss:  Estimated blood loss: none.      [facility]

## 2019-02-23 NOTE — Interval H&P Note (Signed)
History and Physical Interval Note:  02/23/2019 8:26 AM  Dillon Graves  has presented today for surgery, with the diagnosis of CIRRHOSIS.  The various methods of treatment have been discussed with the patient and family. After consideration of risks, benefits and other options for treatment, the patient has consented to  Procedure(s): ESOPHAGOGASTRODUODENOSCOPY (EGD) WITH PROPOFOL (N/A) as a surgical intervention.  The patient's history has been reviewed, patient examined, no change in status, stable for surgery.  I have reviewed the patient's chart and labs.  Questions were answered to the patient's satisfaction.     [place], [place]

## 2019-02-23 NOTE — Anesthesia Preprocedure Evaluation (Addendum)
Anesthesia Evaluation  Patient identified by MRN, date of birth, ID band Patient awake    Reviewed: Allergy & Precautions, H&P , NPO status , Patient's Chart, lab work & pertinent test results  History of Anesthesia Complications (+) PONV and history of anesthetic complications  Airway Mallampati: III  TM Distance: >3 FB Neck ROM: full    Dental  (+) Teeth Intact   Pulmonary sleep apnea , neg COPD,           Cardiovascular hypertension, (-) angina(-) Past MI and (-) Cardiac Stents (-) dysrhythmias      Neuro/Psych negative neurological ROS  negative psych ROS   GI/Hepatic GERD  Controlled,(+) Cirrhosis       , Portal hypertension   Endo/Other  negative endocrine ROSdiabetes  Renal/GU CRFRenal disease  negative genitourinary   Musculoskeletal   Abdominal   Peds  Hematology  (+) Blood dyscrasia, anemia , thrombocytopenia   Anesthesia Other Findings Past Medical History: No date: Anemia     Comment:  IDA No date: Anesthesia complication No date: Arthritis No date: Carpal tunnel syndrome No date: Carpal tunnel syndrome     Comment:  RIGHT HAND No date: Carpal tunnel syndrome No date: Chronic kidney disease     Comment:  STAGE 3 No date: Cirrhosis of liver without ascites (HCC) No date: Complication of anesthesia     Comment:  SLOW TO WAKE UP No date: Constipation No date: Constipation No date: Diabetes mellitus without complication (HCC) No date: Edema No date: GERD (gastroesophageal reflux disease) No date: Gout No date: Gout No date: Hypercholesteremia No date: Hypertension No date: Incoordination No date: Ischial bursitis No date: Muscle weakness No date: Osteoarthritis No date: PONV (postoperative nausea and vomiting) No date: Portal venous hypertension (HCC) No date: Post-operative nausea and vomiting No date: Sleep apnea No date: SOB (shortness of breath) No date: Splenomegaly No date:  Splenomegaly No date: Thrombocytopenia ([place]) No date: Vertigo  Past Surgical History: No date: CARPAL TUNNEL RELEASE No date: CATARACT EXTRACTION W/ INTRAOCULAR LENS IMPLANT; Bilateral 07/15/2017: CHOLECYSTECTOMY No date: COLONOSCOPY 06/09/2018: ESOPHAGOGASTRODUODENOSCOPY (EGD) WITH PROPOFOL; N/A     Comment:  Procedure: ESOPHAGOGASTRODUODENOSCOPY (EGD) WITH               PROPOFOL;  Surgeon: Toledo, Benay Pike, MD;  Location:               [facility] ENDOSCOPY;  Service: Gastroenterology;  Laterality:               N/A; 09/22/2018: ESOPHAGOGASTRODUODENOSCOPY (EGD) WITH PROPOFOL; N/A     Comment:  Procedure: ESOPHAGOGASTRODUODENOSCOPY (EGD) WITH               PROPOFOL;  Surgeon: Toledo, Benay Pike, MD;  Location:               [facility] ENDOSCOPY;  Service: Gastroenterology;  Laterality:               N/A; 10/20/2018: ESOPHAGOGASTRODUODENOSCOPY (EGD) WITH PROPOFOL; N/A     Comment:  Procedure: ESOPHAGOGASTRODUODENOSCOPY (EGD) WITH               PROPOFOL;  Surgeon: Toledo, Benay Pike, MD;  Location:               [facility] ENDOSCOPY;  Service: Gastroenterology;  Laterality:               N/A; No date: EYE SURGERY No date: JOINT REPLACEMENT; Left     Comment:  Hip No date: JOINT REPLACEMENT;  Right     Comment:  Knee No date: LIVER BIOPSY No date: ROTATOR CUFF REPAIR     Reproductive/Obstetrics negative OB ROS                            Anesthesia Physical Anesthesia Plan  ASA: III  Anesthesia Plan: General   Post-op Pain Management:    Induction:   PONV Risk Score and Plan: Propofol infusion and TIVA  Airway Management Planned: Natural Airway  Additional Equipment:   Intra-op Plan:   Post-operative Plan:   Informed Consent: I have reviewed the patients History and Physical, chart, labs and discussed the procedure including the risks, benefits and alternatives for the proposed anesthesia with the patient or authorized representative who has indicated his/her  understanding and acceptance.     Dental Advisory Given  Plan Discussed with: Anesthesiologist  Anesthesia Plan Comments:         Anesthesia Quick Evaluation

## 2019-02-23 NOTE — Transfer of Care (Signed)
Immediate Anesthesia Transfer of Care Note  Patient: Dillon Graves  Procedure(s) Performed: ESOPHAGOGASTRODUODENOSCOPY (EGD) WITH PROPOFOL (N/A )  Patient Location: PACU  Anesthesia Type:General  Level of Consciousness: awake and alert   Airway & Oxygen Therapy: Patient Spontanous Breathing and Patient connected to face mask oxygen  Post-op Assessment: Report given to RN and Post -op Vital signs reviewed and stable  Post vital signs: Reviewed and stable  Last Vitals:  Vitals Value Taken Time  BP 91/57 02/23/19 0838  Temp 36.5 C 02/23/19 0837  Pulse 59 02/23/19 0839  Resp 19 02/23/19 0839  SpO2 99 % 02/23/19 0839  Vitals shown include unvalidated device data.  Last Pain:  Vitals:   02/23/19 0837  TempSrc: Temporal  PainSc: Asleep         Complications: No apparent anesthesia complications

## 2019-02-23 NOTE — Anesthesia Postprocedure Evaluation (Signed)
Anesthesia Post Note  Patient: ALEK BORGES  Procedure(s) Performed: ESOPHAGOGASTRODUODENOSCOPY (EGD) WITH PROPOFOL (N/A )  Patient location during evaluation: PACU Anesthesia Type: General Level of consciousness: awake and alert Pain management: pain level controlled Vital Signs Assessment: post-procedure vital signs reviewed and stable Respiratory status: spontaneous breathing, nonlabored ventilation and respiratory function stable Cardiovascular status: blood pressure returned to baseline and stable Postop Assessment: no apparent nausea or vomiting Anesthetic complications: no     Last Vitals:  Vitals:   02/23/19 0837 02/23/19 0907  BP: (!) 91/57 114/67  Pulse:    Resp: 16   Temp: 36.5 C   SpO2: 99%     Last Pain:  Vitals:   02/23/19 0907  TempSrc:   PainSc: 0-No pain                 Karleen Hampshire

## 2019-02-23 NOTE — H&P (Signed)
Outpatient short stay form Pre-procedure 02/23/2019 8:24 AM Dillon Graves K. [name], M.D.  Primary Physician: [name], M.D.  Reason for visit:  Esophageal varices  History of present illness:  Patient presenting for a hx of cryptogenic cirrhosis and hx of esophageal varices, here for variceal eradication. Denies hemetemesis, melena, abdominal pain except occasional discomfort from ventral hernia. No jaundice or weight loss.     Current Facility-Administered Medications:  .  0.9 %  sodium chloride infusion, , Intravenous, Continuous, Gautier, Benay Pike, MD, Last Rate: 20 mL/hr at 02/23/19 0747, 1,000 mL at 02/23/19 0747  Medications Prior to Admission  Medication Sig Dispense Refill Last Dose  . aspirin 81 MG tablet Take 81 mg by mouth daily.   Past Week at Unknown time  . atorvastatin (LIPITOR) 40 MG tablet Take 40 mg by mouth daily.   02/22/2019 at Unknown time  . Bromfenac Sodium (PROLENSA) 0.07 % SOLN Apply 1 drop to eye daily.   Past Week at Unknown time  . cycloSPORINE (RESTASIS) 0.05 % ophthalmic emulsion Place 1 drop into both eyes 2 (two) times daily.   Past Week at Unknown time  . docusate sodium (COLACE) 100 MG capsule Take 100 mg by mouth 2 (two) times daily.   Past Week at Unknown time  . empagliflozin (JARDIANCE) 25 MG TABS tablet Take 25 mg by mouth daily.   02/22/2019 at Unknown time  . ergocalciferol (VITAMIN D2) 50000 UNITS capsule Take 50,000 Units by mouth once a week.   Past Week at Unknown time  . ezetimibe (ZETIA) 10 MG tablet Take 10 mg by mouth daily.   Past Week at Unknown time  . febuxostat (ULORIC) 40 MG tablet Take 40 mg by mouth daily.   Past Week at Unknown time  . Ferrous Fumarate (HEMOCYTE - 106 MG FE) 324 (106 Fe) MG TABS tablet Take 1 tablet by mouth daily.   Past Week at Unknown time  . furosemide (LASIX) 80 MG tablet Take 80 mg by mouth daily.   02/22/2019 at Unknown time  . insulin glargine (LANTUS) 100 UNIT/ML injection Inject 60 Units into the skin at  bedtime.   02/23/2019 at 0600  . lactulose (CHRONULAC) 10 GM/15ML solution Take 30 g by mouth 2 (two) times daily as needed for mild constipation.   02/22/2019 at Unknown time  . lisinopril-hydrochlorothiazide (PRINZIDE,ZESTORETIC) 20-25 MG tablet Take 1 tablet by mouth daily.   02/22/2019 at Unknown time  . nadolol (CORGARD) 40 MG tablet Take 40 mg by mouth daily.   02/22/2019 at Unknown time  . nitroGLYCERIN (NITROSTAT) 0.4 MG SL tablet Place 0.4 mg under the tongue every 5 (five) minutes as needed for chest pain.   Past Month at Unknown time  . omeprazole (PRILOSEC) 40 MG capsule Take 40 mg by mouth daily.   02/22/2019 at Unknown time  . ondansetron (ZOFRAN ODT) 8 MG disintegrating tablet Take 1 tablet (8 mg total) by mouth 2 (two) times daily. 6 tablet 0 02/22/2019 at Unknown time  . rifaximin (XIFAXAN) 550 MG TABS tablet Take 550 mg by mouth 2 (two) times daily.   02/22/2019 at Unknown time  . sitaGLIPtin-metformin (JANUMET) 50-1000 MG tablet Take 1 tablet by mouth 2 (two) times daily with a meal.   02/22/2019 at Unknown time  . Multiple Vitamin (MULTIVITAMIN) tablet Take 1 tablet by mouth daily.        Allergies  Allergen Reactions  . Morphine And Related Nausea And Vomiting     Past Medical History:  Diagnosis Date  . Anemia    IDA  . Anesthesia complication   . Arthritis   . Carpal tunnel syndrome   . Carpal tunnel syndrome    RIGHT HAND  . Carpal tunnel syndrome   . Chronic kidney disease    STAGE 3  . Cirrhosis of liver without ascites (HCC)   . Complication of anesthesia    SLOW TO WAKE UP  . Constipation   . Constipation   . Diabetes mellitus without complication (HCC)   . Edema   . GERD (gastroesophageal reflux disease)   . Gout   . Gout   . Hypercholesteremia   . Hypertension   . Incoordination   . Ischial bursitis   . Muscle weakness   . Osteoarthritis   . PONV (postoperative nausea and vomiting)   . Portal venous hypertension (HCC)   . Post-operative nausea  and vomiting   . Sleep apnea   . SOB (shortness of breath)   . Splenomegaly   . Splenomegaly   . Thrombocytopenia (HCC)   . Vertigo     Review of systems:  Otherwise negative.    Physical Exam  Gen: Alert, oriented. Appears stated age.  HEENT: [place]/AT. PERRLA. Lungs: CTA, no wheezes. CV: RR nl S1, S2. Abd: soft, benign, no masses. BS+ Ext: No edema. Pulses 2+    Planned procedures: Proceed with EGD with possible variceal banding. The patient understands the nature of the planned procedure, indications, risks, alternatives and potential complications including but not limited to bleeding, infection, perforation, damage to internal organs and possible oversedation/side effects from anesthesia. The patient agrees and gives consent to proceed.  Please refer to procedure notes for findings, recommendations and patient disposition/instructions.     [name] [name], M.D. Gastroenterology 02/23/2019  8:24 AM

## 2019-02-24 ENCOUNTER — Encounter: Payer: Self-pay | Admitting: *Deleted

## 2019-03-15 ENCOUNTER — Other Ambulatory Visit: Payer: Self-pay | Admitting: Gastroenterology

## 2019-03-15 DIAGNOSIS — K7469 Other cirrhosis of liver: Secondary | ICD-10-CM

## 2019-03-15 DIAGNOSIS — I8510 Secondary esophageal varices without bleeding: Secondary | ICD-10-CM

## 2019-03-15 DIAGNOSIS — D696 Thrombocytopenia, unspecified: Secondary | ICD-10-CM

## 2019-03-21 ENCOUNTER — Ambulatory Visit: Payer: Commercial Managed Care - PPO

## 2019-03-28 ENCOUNTER — Ambulatory Visit
Admission: RE | Admit: 2019-03-28 | Discharge: 2019-03-28 | Disposition: A | Discharge status: Home / Self Care | Payer: Commercial Managed Care - PPO | Source: Ambulatory Visit | Attending: Gastroenterology | Admitting: Gastroenterology

## 2019-03-28 ENCOUNTER — Other Ambulatory Visit: Payer: Self-pay

## 2019-03-28 DIAGNOSIS — I8510 Secondary esophageal varices without bleeding: Secondary | ICD-10-CM

## 2019-03-28 DIAGNOSIS — K7469 Other cirrhosis of liver: Secondary | ICD-10-CM

## 2019-03-28 DIAGNOSIS — D696 Thrombocytopenia, unspecified: Secondary | ICD-10-CM | POA: Diagnosis present

## 2019-09-22 ENCOUNTER — Other Ambulatory Visit (HOSPITAL_COMMUNITY): Payer: Self-pay | Admitting: Gastroenterology

## 2019-09-22 ENCOUNTER — Other Ambulatory Visit: Payer: Self-pay | Admitting: Gastroenterology

## 2019-09-22 DIAGNOSIS — K7469 Other cirrhosis of liver: Secondary | ICD-10-CM

## 2019-10-12 ENCOUNTER — Ambulatory Visit: Payer: Commercial Managed Care - PPO

## 2019-10-26 ENCOUNTER — Ambulatory Visit
Admission: RE | Admit: 2019-10-26 | Discharge: 2019-10-26 | Disposition: A | Discharge status: Home / Self Care | Payer: Commercial Managed Care - PPO | Source: Ambulatory Visit | Attending: Gastroenterology | Admitting: Gastroenterology

## 2019-10-26 ENCOUNTER — Other Ambulatory Visit: Payer: Self-pay

## 2019-10-26 DIAGNOSIS — K7469 Other cirrhosis of liver: Secondary | ICD-10-CM | POA: Insufficient documentation

## 2019-10-26 MED ORDER — GADOBUTROL 1 MMOL/ML IV SOLN
10.0000 mL | Freq: Once | INTRAVENOUS | Status: AC | PRN
  Administered 2019-10-26: 10 mL via INTRAVENOUS

## 2019-11-14 ENCOUNTER — Other Ambulatory Visit
Admission: RE | Admit: 2019-11-14 | Discharge: 2019-11-14 | Disposition: A | Discharge status: Home / Self Care | Payer: Commercial Managed Care - PPO | Source: Ambulatory Visit | Attending: Gastroenterology | Admitting: Gastroenterology

## 2019-11-14 ENCOUNTER — Other Ambulatory Visit: Payer: Self-pay

## 2019-11-14 ENCOUNTER — Other Ambulatory Visit
Admission: RE | Admit: 2019-11-14 | Discharge: 2019-11-14 | Disposition: A | Discharge status: Home / Self Care | Payer: Commercial Managed Care - PPO | Source: Ambulatory Visit | Attending: Internal Medicine | Admitting: Internal Medicine

## 2019-11-14 DIAGNOSIS — Z20822 Contact with and (suspected) exposure to covid-19: Secondary | ICD-10-CM | POA: Diagnosis not present

## 2019-11-14 DIAGNOSIS — K7469 Other cirrhosis of liver: Secondary | ICD-10-CM | POA: Insufficient documentation

## 2019-11-14 DIAGNOSIS — Z01812 Encounter for preprocedural laboratory examination: Secondary | ICD-10-CM | POA: Insufficient documentation

## 2019-11-14 LAB — AMMONIA: Ammonia: 67 umol/L — ABNORMAL HIGH (ref 9–35)

## 2019-11-14 LAB — SARS CORONAVIRUS 2 (TAT 6-24 HRS): SARS Coronavirus 2: NEGATIVE

## 2019-11-16 ENCOUNTER — Ambulatory Visit
Admission: RE | Admit: 2019-11-16 | Payer: Commercial Managed Care - PPO | Source: Home / Self Care | Admitting: Internal Medicine

## 2019-11-16 ENCOUNTER — Ambulatory Visit: Payer: Commercial Managed Care - PPO | Admitting: Anesthesiology

## 2019-11-16 ENCOUNTER — Encounter: Admission: RE | Payer: Self-pay | Source: Home / Self Care

## 2019-11-16 ENCOUNTER — Encounter
Admission: RE | Disposition: A | Discharge status: Home / Self Care | Payer: Self-pay | Source: Ambulatory Visit | Attending: Internal Medicine

## 2019-11-16 ENCOUNTER — Ambulatory Visit
Admission: RE | Admit: 2019-11-16 | Discharge: 2019-11-16 | Disposition: A | Discharge status: Home / Self Care | Payer: Commercial Managed Care - PPO | Source: Ambulatory Visit | Attending: Internal Medicine | Admitting: Internal Medicine

## 2019-11-16 ENCOUNTER — Encounter: Payer: Self-pay | Admitting: Internal Medicine

## 2019-11-16 DIAGNOSIS — K648 Other hemorrhoids: Secondary | ICD-10-CM | POA: Insufficient documentation

## 2019-11-16 DIAGNOSIS — Z1211 Encounter for screening for malignant neoplasm of colon: Secondary | ICD-10-CM | POA: Diagnosis not present

## 2019-11-16 DIAGNOSIS — K640 First degree hemorrhoids: Secondary | ICD-10-CM | POA: Insufficient documentation

## 2019-11-16 DIAGNOSIS — Z8719 Personal history of other diseases of the digestive system: Secondary | ICD-10-CM | POA: Insufficient documentation

## 2019-11-16 DIAGNOSIS — K635 Polyp of colon: Secondary | ICD-10-CM | POA: Diagnosis not present

## 2019-11-16 DIAGNOSIS — K6389 Other specified diseases of intestine: Secondary | ICD-10-CM | POA: Insufficient documentation

## 2019-11-16 HISTORY — PX: COLONOSCOPY WITH PROPOFOL: SHX5780

## 2019-11-16 LAB — GLUCOSE, CAPILLARY: Glucose-Capillary: 136 mg/dL — ABNORMAL HIGH (ref 70–99)

## 2019-11-16 SURGERY — COLONOSCOPY WITH PROPOFOL
Anesthesia: General

## 2019-11-16 MED ORDER — LIDOCAINE HCL (CARDIAC) PF 100 MG/5ML IV SOSY
PREFILLED_SYRINGE | INTRAVENOUS | Status: DC | PRN
  Administered 2019-11-16: 80 mg via INTRAVENOUS

## 2019-11-16 MED ORDER — EPHEDRINE SULFATE 50 MG/ML IJ SOLN
INTRAMUSCULAR | Status: DC | PRN
  Administered 2019-11-16: 7.5 mg via INTRAVENOUS
  Administered 2019-11-16: 10 mg via INTRAVENOUS

## 2019-11-16 MED ORDER — SODIUM CHLORIDE 0.9 % IV SOLN: INTRAVENOUS | Status: DC

## 2019-11-16 MED ORDER — PROPOFOL 500 MG/50ML IV EMUL
INTRAVENOUS | Status: DC | PRN
  Administered 2019-11-16: 75 ug/kg/min via INTRAVENOUS

## 2019-11-16 MED ORDER — PROPOFOL 10 MG/ML IV BOLUS
INTRAVENOUS | Status: DC | PRN
  Administered 2019-11-16 (×2): 20 mg via INTRAVENOUS
  Administered 2019-11-16: 50 mg via INTRAVENOUS

## 2019-11-16 MED ORDER — PHENYLEPHRINE HCL (PRESSORS) 10 MG/ML IV SOLN
INTRAVENOUS | Status: DC | PRN
  Administered 2019-11-16 (×2): 100 ug via INTRAVENOUS

## 2019-11-16 MED ORDER — GLYCOPYRROLATE 0.2 MG/ML IJ SOLN
INTRAMUSCULAR | Status: DC | PRN
  Administered 2019-11-16: .2 mg via INTRAVENOUS

## 2019-11-16 NOTE — Transfer of Care (Signed)
Immediate Anesthesia Transfer of Care Note  Patient: Dillon Graves  Procedure(s) Performed: COLONOSCOPY WITH PROPOFOL (N/A )  Patient Location: PACU and Endoscopy Unit  Anesthesia Type:General  Level of Consciousness: awake, drowsy and patient cooperative  Airway & Oxygen Therapy: Patient Spontanous Breathing  Post-op Assessment: Report given to RN and Post -op Vital signs reviewed and stable  Post vital signs: Reviewed and stable  Last Vitals:  Vitals Value Taken Time  BP 99/67 11/16/19 0916  Temp    Pulse 62 11/16/19 0920  Resp 16 11/16/19 0920  SpO2 99 % 11/16/19 0920  Vitals shown include unvalidated device data.  Last Pain:  Vitals:   11/16/19 0910  TempSrc: Temporal  PainSc:          Complications: No complications documented.

## 2019-11-16 NOTE — Op Note (Signed)
[facility] Gastroenterology Patient Name: Dillon Graves Procedure Date: 11/16/2019 8:30 AM MRN: [id number] Account #: [id number] Date of Birth: May 18, 1950 Admit Type: Outpatient Age: 69 Room: [facility] ENDO ROOM 2 Gender: Male Note Status: Finalized Procedure:             Colonoscopy Indications:           Surveillance: Personal history of adenomatous polyps                         on last colonoscopy > 3 years ago Providers:             Royce Macadamia K. Shaianne Nucci MD, MD Medicines:             Propofol per Anesthesia Complications:         No immediate complications. Procedure:             Pre-Anesthesia Assessment:                        - The risks and benefits of the procedure and the                         sedation options and risks were discussed with the                         patient. All questions were answered and informed                         consent was obtained.                        - Patient identification and proposed procedure were                         verified prior to the procedure by the nurse. The                         procedure was verified in the pre-procedure area in                         the procedure room.                        - ASA Grade Assessment: III - A patient with severe                         systemic disease.                        - After reviewing the risks and benefits, the patient                         was deemed in satisfactory condition to undergo the                         procedure.                        After obtaining informed consent, the colonoscope was  passed under direct vision. Throughout the procedure,                         the patient's blood pressure, pulse, and oxygen                         saturations were monitored continuously. The                         Colonoscope was introduced through the anus and                         advanced to the the cecum, identified by  appendiceal                         orifice and ileocecal valve. The colonoscopy was                         technically difficult and complex due to significant                         looping. Successful completion of the procedure was                         aided by applying abdominal pressure. The patient                         tolerated the procedure well. The quality of the bowel                         preparation was adequate to identify polyps 6 mm and                         larger in size. The ileocecal valve, appendiceal                         orifice, and rectum were photographed. Findings:      The perianal and digital rectal examinations were normal. Pertinent       negatives include normal sphincter tone and no palpable rectal lesions.      A diffuse area of moderately nodular mucosa was found in the cecum.       Biopsies were taken with a cold forceps for histology.      A 5 mm polyp was found in the cecum. The polyp was sessile. The polyp       was removed with a jumbo cold forceps. Resection and retrieval were       complete.      A 12 mm polyp was found in the proximal ascending colon. The polyp was       semi-pedunculated. The polyp was removed with a piecemeal technique       using a hot snare. Resection and retrieval were complete. To prevent       bleeding after the polypectomy, one hemostatic clip was successfully       placed (MR conditional). There was no bleeding during, or at the end, of       the procedure.      A moderate amount of stool was found in the sigmoid colon and in the  descending colon, making visualization difficult. Lavage of the area was       performed using a moderate amount of sterile water, resulting in       clearance with fair visualization.      Non-bleeding internal hemorrhoids were found during retroflexion. The       hemorrhoids were Grade I (internal hemorrhoids that do not prolapse).      The exam was otherwise without  abnormality. Impression:            - Nodular mucosa in the cecum. Biopsied.                        - One 5 mm polyp in the cecum, removed with a jumbo                         cold forceps. Resected and retrieved.                        - One 12 mm polyp in the proximal ascending colon,                         removed piecemeal using a hot snare. Resected and                         retrieved. Clip (MR conditional) was placed.                        - Stool in the sigmoid colon and in the descending                         colon.                        - Non-bleeding internal hemorrhoids.                        - The examination was otherwise normal. Recommendation:        - Patient has a contact number available for                         emergencies. The signs and symptoms of potential                         delayed complications were discussed with the patient.                         Return to normal activities tomorrow. Written                         discharge instructions were provided to the patient.                        - Resume previous diet.                        - Continue present medications.                        - Await pathology results.                        -  Repeat colonoscopy is recommended for surveillance                         of multiple adenomas. The colonoscopy date will be                         determined after pathology results from today's exam                         become available for review.                        - Return to GI office at appointment to be scheduled.                        - Perform an upper GI endoscopy as previously                         scheduled.                        - The findings and recommendations were discussed with                         the patient. Procedure Code(s):     --- Professional ---                        [phone], Colonoscopy, flexible; with removal of                         tumor(s), polyp(s), or  other lesion(s) by snare                         technique                        [id number], 59, Colonoscopy, flexible; with biopsy, single                         or multiple Diagnosis Code(s):     --- Professional ---                        K64.0, First degree hemorrhoids                        K63.5, Polyp of colon                        K63.89, Other specified diseases of intestine                        Z86.010, Personal history of colonic polyps CPT copyright 2019 American [employer]. All rights reserved. The codes documented in this report are preliminary and upon coder review may  be revised to meet current compliance requirements. Stanton Kidney MD, MD 11/16/2019 9:13:58 AM This report has been signed electronically. Number of Addenda: 0 Note Initiated On: 11/16/2019 8:30 AM Scope Withdrawal Time: 0 hours 18 minutes 42 seconds  Total Procedure Duration: 0 hours 29 minutes 22 seconds  Estimated Blood Loss:  Estimated blood loss: none.  [facility]

## 2019-11-16 NOTE — Anesthesia Preprocedure Evaluation (Signed)
Anesthesia Evaluation  Patient identified by MRN, date of birth, ID band Patient awake    Reviewed: Allergy & Precautions, NPO status , Patient's Chart, lab work & pertinent test results  History of Anesthesia Complications (+) PONV and history of anesthetic complications  Airway Mallampati: II  TM Distance: >3 FB Neck ROM: Full    Dental no notable dental hx.    Pulmonary sleep apnea , neg COPD,    breath sounds clear to auscultation- rhonchi (-) wheezing      Cardiovascular hypertension, Pt. on medications (-) CAD, (-) Past MI, (-) Cardiac Stents and (-) CABG  Rhythm:Regular Rate:Normal - Systolic murmurs and - Diastolic murmurs    Neuro/Psych neg Seizures negative neurological ROS  negative psych ROS   GI/Hepatic GERD  ,(+) Cirrhosis   Esophageal Varices    ,   Endo/Other  diabetes, Insulin Dependent  Renal/GU CRFRenal disease     Musculoskeletal  (+) Arthritis ,   Abdominal (+) + obese,   Peds  Hematology  (+) anemia ,   Anesthesia Other Findings Past Medical History: No date: Anemia     Comment:  IDA No date: Anesthesia complication No date: Arthritis No date: Carpal tunnel syndrome No date: Carpal tunnel syndrome     Comment:  RIGHT HAND No date: Carpal tunnel syndrome No date: Chronic kidney disease     Comment:  STAGE 3 No date: Cirrhosis of liver without ascites (HCC) No date: Complication of anesthesia     Comment:  SLOW TO WAKE UP No date: Constipation No date: Constipation No date: Diabetes mellitus without complication (HCC) No date: Edema No date: GERD (gastroesophageal reflux disease) No date: Gout No date: Gout No date: Hypercholesteremia No date: Hypertension No date: Incoordination No date: Ischial bursitis No date: Muscle weakness No date: Osteoarthritis No date: PONV (postoperative nausea and vomiting) No date: Portal venous hypertension (HCC) No date: Post-operative  nausea and vomiting No date: Sleep apnea No date: SOB (shortness of breath) No date: Splenomegaly No date: Splenomegaly No date: Thrombocytopenia (HCC) No date: Vertigo   Reproductive/Obstetrics                             Anesthesia Physical Anesthesia Plan  ASA: III  Anesthesia Plan: General   Post-op Pain Management:    Induction: Intravenous  PONV Risk Score and Plan: 2 and Propofol infusion  Airway Management Planned: Natural Airway  Additional Equipment:   Intra-op Plan:   Post-operative Plan:   Informed Consent: I have reviewed the patients History and Physical, chart, labs and discussed the procedure including the risks, benefits and alternatives for the proposed anesthesia with the patient or authorized representative who has indicated his/her understanding and acceptance.     Dental advisory given  Plan Discussed with: CRNA and Anesthesiologist  Anesthesia Plan Comments:         Anesthesia Quick Evaluation

## 2019-11-16 NOTE — H&P (Signed)
Outpatient short stay form Pre-procedure 11/16/2019 8:28 AM Dillon Graves, M.D.  Primary Physician: Horton Chin, M.D.  Reason for visit: Personal history of colon polyps  History of present illness:                           Patient presents for colonoscopy for a personal hx of colon polyps. The patient denies abdominal pain, abnormal weight loss or rectal bleeding.  Has hx of esophageal varices and cirrhosis and scheduled for repeat EGD in Jan or Feb 2022.     Current Facility-Administered Medications:  .  0.9 %  sodium chloride infusion, , Intravenous, Continuous, [place], Boykin Nearing, MD, Last Rate: 20 mL/hr at 11/16/19 0825, Continued from Pre-op at 11/16/19 0825  Medications Prior to Admission  Medication Sig Dispense Refill Last Dose  . aspirin 81 MG tablet Take 81 mg by mouth daily.   11/14/2019  . atorvastatin (LIPITOR) 40 MG tablet Take 40 mg by mouth daily.   11/14/2019  . Bromfenac Sodium (PROLENSA) 0.07 % SOLN Apply 1 drop to eye daily.   11/14/2019  . cycloSPORINE (RESTASIS) 0.05 % ophthalmic emulsion Place 1 drop into both eyes 2 (two) times daily.   11/14/2019  . docusate sodium (COLACE) 100 MG capsule Take 100 mg by mouth 2 (two) times daily.   11/14/2019  . empagliflozin (JARDIANCE) 25 MG TABS tablet Take 25 mg by mouth daily.   11/14/2019  . ergocalciferol (VITAMIN D2) 50000 UNITS capsule Take 50,000 Units by mouth once a week.   11/12/2019  . ezetimibe (ZETIA) 10 MG tablet Take 10 mg by mouth daily.   11/14/2019  . febuxostat (ULORIC) 40 MG tablet Take 40 mg by mouth daily.   11/14/2019  . Ferrous Fumarate (HEMOCYTE - 106 MG FE) 324 (106 Fe) MG TABS tablet Take 1 tablet by mouth daily.   11/09/2019  . furosemide (LASIX) 80 MG tablet Take 80 mg by mouth daily.   11/14/2019  . insulin glargine (LANTUS) 100 UNIT/ML injection Inject 60 Units into the skin at bedtime.   11/14/2019  . lactulose (CHRONULAC) 10 GM/15ML solution Take 30 g by mouth 2 (two) times daily as needed for  mild constipation.   11/14/2019  . lisinopril-hydrochlorothiazide (PRINZIDE,ZESTORETIC) 20-25 MG tablet Take 1 tablet by mouth daily.   11/14/2019  . Multiple Vitamin (MULTIVITAMIN) tablet Take 1 tablet by mouth daily.   11/13/2019  . nadolol (CORGARD) 40 MG tablet Take 40 mg by mouth daily.   11/14/2019  . nitroGLYCERIN (NITROSTAT) 0.4 MG SL tablet Place 0.4 mg under the tongue every 5 (five) minutes as needed for chest pain.   11/14/2019  . omeprazole (PRILOSEC) 40 MG capsule Take 40 mg by mouth daily.   11/14/2019  . ondansetron (ZOFRAN ODT) 8 MG disintegrating tablet Take 1 tablet (8 mg total) by mouth 2 (two) times daily. 6 tablet 0 11/11/2019  . rifaximin (XIFAXAN) 550 MG TABS tablet Take 550 mg by mouth 2 (two) times daily.   11/14/2019  . sitaGLIPtin-metformin (JANUMET) 50-1000 MG tablet Take 1 tablet by mouth 2 (two) times daily with a meal.   11/14/2019     Allergies  Allergen Reactions  . Morphine And Related Nausea And Vomiting     Past Medical History:  Diagnosis Date  . Anemia    IDA  . Anesthesia complication   . Arthritis   . Carpal tunnel syndrome   . Carpal tunnel syndrome    RIGHT  HAND  . Carpal tunnel syndrome   . Chronic kidney disease    STAGE 3  . Cirrhosis of liver without ascites (HCC)   . Complication of anesthesia    SLOW TO WAKE UP  . Constipation   . Constipation   . Diabetes mellitus without complication (HCC)   . Edema   . GERD (gastroesophageal reflux disease)   . Gout   . Gout   . Hypercholesteremia   . Hypertension   . Incoordination   . Ischial bursitis   . Muscle weakness   . Osteoarthritis   . PONV (postoperative nausea and vomiting)   . Portal venous hypertension (HCC)   . Post-operative nausea and vomiting   . Sleep apnea   . SOB (shortness of breath)   . Splenomegaly   . Splenomegaly   . Thrombocytopenia (HCC)   . Vertigo     Review of systems:  Otherwise negative.    Physical Exam  Gen: Alert, oriented. Appears stated age.   HEENT: [place]/AT. PERRLA. Lungs: CTA, no wheezes. CV: RR nl S1, S2. Abd: soft, benign, no masses. BS+ Ext: No edema. Pulses 2+    Planned procedures: Proceed with colonoscopy. The patient understands the nature of the planned procedure, indications, risks, alternatives and potential complications including but not limited to bleeding, infection, perforation, damage to internal organs and possible oversedation/side effects from anesthesia. The patient agrees and gives consent to proceed.  Please refer to procedure notes for findings, recommendations and patient disposition/instructions.     Dillon Graves, M.D. Gastroenterology 11/16/2019  8:28 AM

## 2019-11-16 NOTE — Interval H&P Note (Signed)
History and Physical Interval Note:  11/16/2019 8:29 AM  Dillon Graves  has presented today for surgery, with the diagnosis of PRS HX POLYPS.  The various methods of treatment have been discussed with the patient and family. After consideration of risks, benefits and other options for treatment, the patient has consented to  Procedure(s): COLONOSCOPY WITH PROPOFOL (N/A) as a surgical intervention.  The patient's history has been reviewed, patient examined, no change in status, stable for surgery.  I have reviewed the patient's chart and labs.  Questions were answered to the patient's satisfaction.     [place], [place]

## 2019-11-16 NOTE — Anesthesia Postprocedure Evaluation (Signed)
Anesthesia Post Note  Patient: Dillon Graves  Procedure(s) Performed: COLONOSCOPY WITH PROPOFOL (N/A )  Patient location during evaluation: Endoscopy Anesthesia Type: General Level of consciousness: awake and alert and oriented Pain management: pain level controlled Vital Signs Assessment: post-procedure vital signs reviewed and stable Respiratory status: spontaneous breathing, nonlabored ventilation and respiratory function stable Cardiovascular status: blood pressure returned to baseline and stable Postop Assessment: no signs of nausea or vomiting Anesthetic complications: no   No complications documented.   Last Vitals:  Vitals:   11/16/19 0930 11/16/19 0940  BP: 106/67 107/62  Pulse: 62 (!) 54  Resp: 15 18  Temp:    SpO2: 100% 99%    Last Pain:  Vitals:   11/16/19 0910  TempSrc: Temporal  PainSc:                  Keenon Leitzel

## 2019-11-17 ENCOUNTER — Encounter: Payer: Self-pay | Admitting: Internal Medicine

## 2019-11-18 LAB — SURGICAL PATHOLOGY

## 2020-02-06 ENCOUNTER — Other Ambulatory Visit: Payer: Self-pay

## 2020-02-06 ENCOUNTER — Other Ambulatory Visit
Admission: RE | Admit: 2020-02-06 | Discharge: 2020-02-06 | Disposition: A | Discharge status: Home / Self Care | Payer: Commercial Managed Care - PPO | Source: Ambulatory Visit | Attending: Internal Medicine | Admitting: Internal Medicine

## 2020-02-06 DIAGNOSIS — Z20822 Contact with and (suspected) exposure to covid-19: Secondary | ICD-10-CM | POA: Insufficient documentation

## 2020-02-06 DIAGNOSIS — Z01812 Encounter for preprocedural laboratory examination: Secondary | ICD-10-CM | POA: Insufficient documentation

## 2020-02-06 LAB — SARS CORONAVIRUS 2 (TAT 6-24 HRS): SARS Coronavirus 2: NEGATIVE

## 2020-02-07 ENCOUNTER — Encounter: Payer: Self-pay | Admitting: Internal Medicine

## 2020-02-08 ENCOUNTER — Ambulatory Visit: Payer: Commercial Managed Care - PPO | Admitting: Anesthesiology

## 2020-02-08 ENCOUNTER — Encounter
Admission: RE | Disposition: A | Discharge status: Home / Self Care | Payer: Self-pay | Source: Home / Self Care | Attending: Internal Medicine

## 2020-02-08 ENCOUNTER — Other Ambulatory Visit: Payer: Self-pay

## 2020-02-08 ENCOUNTER — Ambulatory Visit
Admission: RE | Admit: 2020-02-08 | Discharge: 2020-02-08 | Disposition: A | Discharge status: Home / Self Care | Payer: Commercial Managed Care - PPO | Attending: Internal Medicine | Admitting: Internal Medicine

## 2020-02-08 DIAGNOSIS — K2960 Other gastritis without bleeding: Secondary | ICD-10-CM | POA: Diagnosis not present

## 2020-02-08 DIAGNOSIS — Z794 Long term (current) use of insulin: Secondary | ICD-10-CM | POA: Insufficient documentation

## 2020-02-08 DIAGNOSIS — K31811 Angiodysplasia of stomach and duodenum with bleeding: Secondary | ICD-10-CM | POA: Diagnosis not present

## 2020-02-08 DIAGNOSIS — K3189 Other diseases of stomach and duodenum: Secondary | ICD-10-CM | POA: Insufficient documentation

## 2020-02-08 DIAGNOSIS — Z79899 Other long term (current) drug therapy: Secondary | ICD-10-CM | POA: Diagnosis not present

## 2020-02-08 DIAGNOSIS — I8500 Esophageal varices without bleeding: Secondary | ICD-10-CM | POA: Diagnosis present

## 2020-02-08 DIAGNOSIS — Z7982 Long term (current) use of aspirin: Secondary | ICD-10-CM | POA: Insufficient documentation

## 2020-02-08 DIAGNOSIS — I8510 Secondary esophageal varices without bleeding: Secondary | ICD-10-CM | POA: Diagnosis not present

## 2020-02-08 DIAGNOSIS — Z7984 Long term (current) use of oral hypoglycemic drugs: Secondary | ICD-10-CM | POA: Insufficient documentation

## 2020-02-08 DIAGNOSIS — K7469 Other cirrhosis of liver: Secondary | ICD-10-CM | POA: Insufficient documentation

## 2020-02-08 DIAGNOSIS — K766 Portal hypertension: Secondary | ICD-10-CM | POA: Diagnosis not present

## 2020-02-08 HISTORY — DX: Dyspnea, unspecified: R06.00

## 2020-02-08 HISTORY — PX: ESOPHAGOGASTRODUODENOSCOPY (EGD) WITH PROPOFOL: SHX5813

## 2020-02-08 LAB — GLUCOSE, CAPILLARY: Glucose-Capillary: 148 mg/dL — ABNORMAL HIGH (ref 70–99)

## 2020-02-08 SURGERY — ESOPHAGOGASTRODUODENOSCOPY (EGD) WITH PROPOFOL
Anesthesia: General

## 2020-02-08 MED ORDER — EPHEDRINE SULFATE 50 MG/ML IJ SOLN
INTRAMUSCULAR | Status: DC | PRN
  Administered 2020-02-08 (×3): 5 mg via INTRAVENOUS

## 2020-02-08 MED ORDER — PROPOFOL 500 MG/50ML IV EMUL
INTRAVENOUS | Status: AC
  Filled 2020-02-08: qty 700

## 2020-02-08 MED ORDER — GLYCOPYRROLATE 0.2 MG/ML IJ SOLN
INTRAMUSCULAR | Status: DC | PRN
  Administered 2020-02-08: .2 mg via INTRAVENOUS

## 2020-02-08 MED ORDER — PHENYLEPHRINE HCL (PRESSORS) 10 MG/ML IV SOLN
INTRAVENOUS | Status: DC | PRN
  Administered 2020-02-08 (×2): 100 ug via INTRAVENOUS

## 2020-02-08 MED ORDER — PROPOFOL 10 MG/ML IV BOLUS
INTRAVENOUS | Status: AC
  Filled 2020-02-08: qty 20

## 2020-02-08 MED ORDER — PROPOFOL 10 MG/ML IV BOLUS
INTRAVENOUS | Status: DC | PRN
  Administered 2020-02-08: 50 mg via INTRAVENOUS

## 2020-02-08 MED ORDER — LIDOCAINE HCL (CARDIAC) PF 100 MG/5ML IV SOSY
PREFILLED_SYRINGE | INTRAVENOUS | Status: DC | PRN
  Administered 2020-02-08: 100 mg via INTRAVENOUS

## 2020-02-08 MED ORDER — VASOPRESSIN 20 UNIT/ML IV SOLN
INTRAVENOUS | Status: DC | PRN
  Administered 2020-02-08: 2 [IU] via INTRAVENOUS

## 2020-02-08 MED ORDER — PROPOFOL 500 MG/50ML IV EMUL
INTRAVENOUS | Status: DC | PRN
  Administered 2020-02-08: 145 ug/kg/min via INTRAVENOUS

## 2020-02-08 MED ORDER — SODIUM CHLORIDE 0.9 % IV SOLN: INTRAVENOUS | Status: DC

## 2020-02-08 NOTE — Anesthesia Postprocedure Evaluation (Signed)
Anesthesia Post Note  Patient: Dillon Graves  Procedure(s) Performed: ESOPHAGOGASTRODUODENOSCOPY (EGD) WITH PROPOFOL (N/A )  Patient location during evaluation: Endoscopy Anesthesia Type: General Level of consciousness: awake and awake and alert Pain management: pain level controlled Vital Signs Assessment: post-procedure vital signs reviewed and stable Respiratory status: spontaneous breathing Cardiovascular status: stable Postop Assessment: no apparent nausea or vomiting Anesthetic complications: no   No complications documented.   Last Vitals:  Vitals:   02/08/20 0948 02/08/20 0955  BP: (!) 84/46 (!) 92/47  Pulse: 67 67  Resp: 13 19  Temp:    SpO2: 96% 96%    Last Pain:  Vitals:   02/08/20 0955  TempSrc:   PainSc: 0-No pain                 Emilio Math

## 2020-02-08 NOTE — Addendum Note (Signed)
Addendum  created 02/08/20 1018 by Mohammed Kindle, CRNA   Child order released for a procedure order, Clinical Note Signed, Flowsheet accepted, Intraprocedure Blocks edited

## 2020-02-08 NOTE — Anesthesia Preprocedure Evaluation (Signed)
Anesthesia Evaluation  Patient identified by MRN, date of birth, ID band Patient awake    Reviewed: Allergy & Precautions, H&P , NPO status , Patient's Chart, lab work & pertinent test results  History of Anesthesia Complications (+) PONV and history of anesthetic complications  Airway Mallampati: II       Dental no notable dental hx.    Pulmonary shortness of breath, sleep apnea ,    Pulmonary exam normal breath sounds clear to auscultation       Cardiovascular hypertension, negative cardio ROS Normal cardiovascular exam Rhythm:Regular Rate:Normal     Neuro/Psych CTS  Neuromuscular disease negative psych ROS   GI/Hepatic Neg liver ROS, GERD  ,  Endo/Other  negative endocrine ROSdiabetes  Renal/GU CRFRenal disease  negative genitourinary   Musculoskeletal  (+) Arthritis ,   Abdominal   Peds negative pediatric ROS (+)  Hematology negative hematology ROS (+)   Anesthesia Other Findings Past Medical History: No date: Anemia     Comment:  IDA No date: Anesthesia complication No date: Arthritis No date: Carpal tunnel syndrome No date: Carpal tunnel syndrome     Comment:  RIGHT HAND No date: Carpal tunnel syndrome No date: Chronic kidney disease     Comment:  STAGE 3 No date: Cirrhosis of liver without ascites (HCC) No date: Complication of anesthesia     Comment:  SLOW TO WAKE UP No date: Constipation No date: Constipation No date: Diabetes mellitus without complication (HCC) No date: Dyspnea No date: Edema No date: GERD (gastroesophageal reflux disease) No date: Gout No date: Gout No date: Hypercholesteremia No date: Hypertension No date: Incoordination No date: Ischial bursitis No date: Muscle weakness No date: Osteoarthritis No date: PONV (postoperative nausea and vomiting) No date: Portal venous hypertension (HCC) No date: Post-operative nausea and vomiting No date: Sleep apnea No date: SOB  (shortness of breath) No date: Splenomegaly No date: Splenomegaly No date: Thrombocytopenia (HCC) No date: Vertigo   Reproductive/Obstetrics negative OB ROS                             Anesthesia Physical Anesthesia Plan  ASA: III  Anesthesia Plan: General   Post-op Pain Management:    Induction: Intravenous  PONV Risk Score and Plan: 0, 1 and 2 and Propofol infusion  Airway Management Planned: Nasal Cannula and Natural Airway  Additional Equipment:   Intra-op Plan:   Post-operative Plan:   Informed Consent: I have reviewed the patients History and Physical, chart, labs and discussed the procedure including the risks, benefits and alternatives for the proposed anesthesia with the patient or authorized representative who has indicated his/her understanding and acceptance.       Plan Discussed with: CRNA, Anesthesiologist and Surgeon  Anesthesia Plan Comments:         Anesthesia Quick Evaluation

## 2020-02-08 NOTE — Interval H&P Note (Signed)
History and Physical Interval Note:  02/08/2020 8:32 AM  Dillon Graves  has presented today for surgery, with the diagnosis of Cryptogenic Cirrhosis of Liver.  The various methods of treatment have been discussed with the patient and family. After consideration of risks, benefits and other options for treatment, the patient has consented to  Procedure(s): ESOPHAGOGASTRODUODENOSCOPY (EGD) WITH PROPOFOL (N/A) as a surgical intervention.  The patient's history has been reviewed, patient examined, no change in status, stable for surgery.  I have reviewed the patient's chart and labs.  Questions were answered to the patient's satisfaction.     [place], [place]

## 2020-02-08 NOTE — Transfer of Care (Signed)
Immediate Anesthesia Transfer of Care Note  Patient: Dillon Graves  Procedure(s) Performed: ESOPHAGOGASTRODUODENOSCOPY (EGD) WITH PROPOFOL (N/A )  Patient Location: Endoscopy Unit  Anesthesia Type:General  Level of Consciousness: drowsy and patient cooperative  Airway & Oxygen Therapy: Patient Spontanous Breathing and Patient connected to face mask oxygen  Post-op Assessment: Report given to RN and Post -op Vital signs reviewed and stable  Post vital signs: Reviewed and stable  Last Vitals:  Vitals Value Taken Time  BP 92/55 02/08/20 0910  Temp 37 C 02/08/20 0908  Pulse 62 02/08/20 0911  Resp 17 02/08/20 0911  SpO2 100 % 02/08/20 0911  Vitals shown include unvalidated device data.  Last Pain:  Vitals:   02/08/20 0908  TempSrc: Temporal  PainSc: Asleep         Complications: No complications documented.

## 2020-02-08 NOTE — Progress Notes (Signed)
Patient vital signs returned to baseline.  Patient is alert and oriented x 4; denies pain, nausea.  Tolerated po liquid.  Ambulated to the bathroom, had bowel movement per patient report.  Vital signs taken after patient up to bathroom and blood pressure improved.  Patient and wife participated in discharge teaching and state they are ready to be discharged to home.

## 2020-02-08 NOTE — Op Note (Signed)
[facility] Patient Name: Dillon Graves Procedure Date: 02/08/2020 8:45 AM MRN: [id number] Account #: [id number] Date of Birth: [birth year] Admit Type: Outpatient Age: 70 Room: [facility] ENDO ROOM 2 Gender: Male Note Status: Finalized Procedure:             Upper GI endoscopy Indications:           Follow-up of esophageal varices in patient with                         suspected portal hypertension, Portal venous                         hypertension Providers:             Boykin Nearing. Mackinzee Roszak MD, MD Medicines:             Propofol per Anesthesia Complications:         No immediate complications. Procedure:             Pre-Anesthesia Assessment:                        - The risks and benefits of the procedure and the                         sedation options and risks were discussed with the                         patient. All questions were answered and informed                         consent was obtained.                        - Patient identification and proposed procedure were                         verified prior to the procedure by the nurse. The                         procedure was verified in the procedure room.                        - ASA Grade Assessment: III - A patient with severe                         systemic disease.                        - After reviewing the risks and benefits, the patient                         was deemed in satisfactory condition to undergo the                         procedure.                        After obtaining informed consent, the endoscope was  passed under direct vision. Throughout the procedure,                         the patient's blood pressure, pulse, and oxygen                         saturations were monitored continuously. The Endoscope                         was introduced through the mouth, and advanced to the                         third part of duodenum. The upper GI  endoscopy was                         accomplished without difficulty. The patient tolerated                         the procedure well. Findings:      Grade I varices were found in the lower third of the esophagus. They       were 7 mm in largest diameter.      Severe portal hypertensive gastropathy was found in the entire examined       stomach.      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 Coagulation for hemostasis using argon plasma at 0.8       liters/minute and 30 watts was successful. Estimated blood loss was       minimal.      One 15 mm mucosal papule (nodule) with no bleeding and no stigmata of       recent bleeding was found in the prepyloric region of the stomach.       Biopsies were taken with a cold forceps for histology.      The examined duodenum was normal. Impression:            - Grade I esophageal varices.                        - Portal hypertensive gastropathy.                        - Gastric antral vascular ectasia with bleeding.                         Treated with argon plasma coagulation (APC).                        - One mucosal papule (nodule) found in the stomach.                         Biopsied.                        - Normal examined duodenum. Recommendation:        - Patient has a contact number available for                         emergencies. The signs and symptoms of potential  delayed complications were discussed with the patient.                         Return to normal activities tomorrow. Written                         discharge instructions were provided to the patient.                        - Resume previous diet.                        - Continue present medications.                        - Await pathology results.                        - Repeat upper endoscopy for surveillance based on                         pathology results.                        -  Return to my office in 6 weeks.                        - The findings and recommendations were discussed with                         the patient. Procedure Code(s):     --- Professional ---                        [phone], 59, Esophagogastroduodenoscopy, flexible,                         transoral; with control of bleeding, any method                        [id number], Esophagogastroduodenoscopy, flexible,                         transoral; with biopsy, single or multiple Diagnosis Code(s):     --- Professional ---                        K31.89, Other diseases of stomach and duodenum                        K31.811, Angiodysplasia of stomach and duodenum with                         bleeding                        K76.6, Portal hypertension                        I85.00, Esophageal varices without bleeding CPT copyright 2019 [employer]. All rights reserved. The codes documented in this report are preliminary and upon coder review may  be revised to meet current compliance requirements. Stanton Kidney MD,  MD 02/08/2020 9:15:41 AM This report has been signed electronically. Number of Addenda: 0 Note Initiated On: 02/08/2020 8:45 AM Estimated Blood Loss:  Estimated blood loss was minimal.      [facility]

## 2020-02-08 NOTE — Anesthesia Procedure Notes (Signed)
Procedure Name: General with mask airway Performed by: Fletcher-Harrison, Mckinlee Dunk, CRNA Pre-anesthesia Checklist: Patient identified, Emergency Drugs available, Suction available and Patient being monitored Patient Re-evaluated:Patient Re-evaluated prior to induction Oxygen Delivery Method: Simple face mask Induction Type: IV induction Placement Confirmation: positive ETCO2 and CO2 detector Dental Injury: Teeth and Oropharynx as per pre-operative assessment        

## 2020-02-08 NOTE — H&P (Signed)
Outpatient short stay form Pre-procedure 02/08/2020 8:31 AM Evetta Renner K. [name], M.D.  Primary Physician: Horton Chin, M.D.  Reason for visit: Portal venous hypertension, esophageal varices.  History of present illness:  70 y/o male here for cryptogenic cirrhosis, portal hypertension with history of esophageal varices. Patient denies intractable heartburn, dysphagia, hemetemesis, abdominal pain, nausea or vomiting.     Current Facility-Administered Medications:  .  0.9 %  sodium chloride infusion, , Intravenous, Continuous, [place], Boykin Nearing, MD, Last Rate: 20 mL/hr at 02/08/20 0815, New Bag at 02/08/20 0815  Medications Prior to Admission  Medication Sig Dispense Refill Last Dose  . aspirin 81 MG tablet Take 81 mg by mouth daily.   02/07/2020 at Unknown time  . atorvastatin (LIPITOR) 40 MG tablet Take 40 mg by mouth daily.   02/07/2020 at Unknown time  . docusate sodium (COLACE) 100 MG capsule Take 100 mg by mouth 2 (two) times daily.   02/07/2020 at Unknown time  . empagliflozin (JARDIANCE) 25 MG TABS tablet Take 25 mg by mouth daily.   02/07/2020 at Unknown time  . ergocalciferol (VITAMIN D2) 50000 UNITS capsule Take 50,000 Units by mouth once a week.   02/07/2020 at Unknown time  . ezetimibe (ZETIA) 10 MG tablet Take 10 mg by mouth daily.   02/07/2020 at Unknown time  . febuxostat (ULORIC) 40 MG tablet Take 40 mg by mouth daily.   02/07/2020 at Unknown time  . Ferrous Fumarate (HEMOCYTE - 106 MG FE) 324 (106 Fe) MG TABS tablet Take 1 tablet by mouth daily.   02/07/2020 at Unknown time  . furosemide (LASIX) 80 MG tablet Take 80 mg by mouth daily.   02/07/2020 at Unknown time  . insulin glargine (LANTUS) 100 UNIT/ML injection Inject 60 Units into the skin at bedtime.   02/07/2020 at Unknown time  . lactulose (CHRONULAC) 10 GM/15ML solution Take 30 g by mouth 2 (two) times daily as needed for mild constipation.   02/07/2020 at Unknown time  . lisinopril-hydrochlorothiazide (PRINZIDE,ZESTORETIC) 20-25 MG  tablet Take 1 tablet by mouth daily.   02/07/2020 at Unknown time  . Multiple Vitamin (MULTIVITAMIN) tablet Take 1 tablet by mouth daily.   02/07/2020 at Unknown time  . nadolol (CORGARD) 40 MG tablet Take 40 mg by mouth daily.   02/07/2020 at Unknown time  . omeprazole (PRILOSEC) 40 MG capsule Take 40 mg by mouth daily.   02/07/2020 at Unknown time  . rifaximin (XIFAXAN) 550 MG TABS tablet Take 550 mg by mouth 2 (two) times daily.   02/07/2020 at Unknown time  . sitaGLIPtin-metformin (JANUMET) 50-1000 MG tablet Take 1 tablet by mouth 2 (two) times daily with a meal.   02/07/2020 at Unknown time  . spironolactone (ALDACTONE) 25 MG tablet Take 25 mg by mouth daily.   02/07/2020 at Unknown time  . Bromfenac Sodium (PROLENSA) 0.07 % SOLN Apply 1 drop to eye daily.     . cycloSPORINE (RESTASIS) 0.05 % ophthalmic emulsion Place 1 drop into both eyes 2 (two) times daily.     . nitroGLYCERIN (NITROSTAT) 0.4 MG SL tablet Place 0.4 mg under the tongue every 5 (five) minutes as needed for chest pain.     [name] ondansetron (ZOFRAN ODT) 8 MG disintegrating tablet Take 1 tablet (8 mg total) by mouth 2 (two) times daily. 6 tablet 0      Allergies  Allergen Reactions  . Morphine And Related Nausea And Vomiting     Past Medical History:  Diagnosis Date  .  Anemia    IDA  . Anesthesia complication   . Arthritis   . Carpal tunnel syndrome   . Carpal tunnel syndrome    RIGHT HAND  . Carpal tunnel syndrome   . Chronic kidney disease    STAGE 3  . Cirrhosis of liver without ascites (HCC)   . Complication of anesthesia    SLOW TO WAKE UP  . Constipation   . Constipation   . Diabetes mellitus without complication (HCC)   . Dyspnea   . Edema   . GERD (gastroesophageal reflux disease)   . Gout   . Gout   . Hypercholesteremia   . Hypertension   . Incoordination   . Ischial bursitis   . Muscle weakness   . Osteoarthritis   . PONV (postoperative nausea and vomiting)   . Portal venous hypertension (HCC)   .  Post-operative nausea and vomiting   . Sleep apnea   . SOB (shortness of breath)   . Splenomegaly   . Splenomegaly   . Thrombocytopenia (HCC)   . Vertigo     Review of systems:  Otherwise negative.    Physical Exam  Gen: Alert, oriented. Appears stated age.  HEENT: [place]/AT. PERRLA. Lungs: CTA, no wheezes. CV: RR nl S1, S2. Abd: soft, benign, no masses. BS+ Ext: No edema. Pulses 2+    Planned procedures: Proceed with EGD. The patient understands the nature of the planned procedure, indications, risks, alternatives and potential complications including but not limited to bleeding, infection, perforation, damage to internal organs and possible oversedation/side effects from anesthesia. The patient agrees and gives consent to proceed.  Please refer to procedure notes for findings, recommendations and patient disposition/instructions.     Reginald Weida K. [name], M.D. Gastroenterology 02/08/2020  8:31 AM

## 2020-02-09 ENCOUNTER — Encounter: Payer: Self-pay | Admitting: Internal Medicine

## 2020-02-09 LAB — SURGICAL PATHOLOGY

## 2020-03-14 ENCOUNTER — Other Ambulatory Visit: Payer: Self-pay

## 2020-03-14 ENCOUNTER — Observation Stay (HOSPITAL_BASED_OUTPATIENT_CLINIC_OR_DEPARTMENT_OTHER)
Admission: EM | Admit: 2020-03-14 | Discharge: 2020-03-15 | Disposition: A | Discharge status: Home / Self Care | Payer: Commercial Managed Care - PPO | Source: Home / Self Care | Attending: Emergency Medicine | Admitting: Emergency Medicine

## 2020-03-14 ENCOUNTER — Encounter: Payer: Self-pay | Admitting: Emergency Medicine

## 2020-03-14 ENCOUNTER — Observation Stay: Payer: Commercial Managed Care - PPO | Admitting: Anesthesiology

## 2020-03-14 ENCOUNTER — Encounter
Admission: EM | Disposition: A | Discharge status: Home / Self Care | Payer: Self-pay | Source: Home / Self Care | Attending: Emergency Medicine

## 2020-03-14 DIAGNOSIS — R161 Splenomegaly, not elsewhere classified: Secondary | ICD-10-CM | POA: Insufficient documentation

## 2020-03-14 DIAGNOSIS — M109 Gout, unspecified: Secondary | ICD-10-CM | POA: Diagnosis present

## 2020-03-14 DIAGNOSIS — Z794 Long term (current) use of insulin: Secondary | ICD-10-CM | POA: Insufficient documentation

## 2020-03-14 DIAGNOSIS — K7469 Other cirrhosis of liver: Secondary | ICD-10-CM | POA: Diagnosis not present

## 2020-03-14 DIAGNOSIS — Z79899 Other long term (current) drug therapy: Secondary | ICD-10-CM | POA: Insufficient documentation

## 2020-03-14 DIAGNOSIS — I1310 Hypertensive heart and chronic kidney disease without heart failure, with stage 1 through stage 4 chronic kidney disease, or unspecified chronic kidney disease: Secondary | ICD-10-CM | POA: Insufficient documentation

## 2020-03-14 DIAGNOSIS — K219 Gastro-esophageal reflux disease without esophagitis: Secondary | ICD-10-CM | POA: Insufficient documentation

## 2020-03-14 DIAGNOSIS — Z20822 Contact with and (suspected) exposure to covid-19: Secondary | ICD-10-CM | POA: Insufficient documentation

## 2020-03-14 DIAGNOSIS — K920 Hematemesis: Secondary | ICD-10-CM | POA: Insufficient documentation

## 2020-03-14 DIAGNOSIS — D509 Iron deficiency anemia, unspecified: Secondary | ICD-10-CM | POA: Insufficient documentation

## 2020-03-14 DIAGNOSIS — D696 Thrombocytopenia, unspecified: Secondary | ICD-10-CM | POA: Insufficient documentation

## 2020-03-14 DIAGNOSIS — I8501 Esophageal varices with bleeding: Secondary | ICD-10-CM | POA: Diagnosis not present

## 2020-03-14 DIAGNOSIS — E119 Type 2 diabetes mellitus without complications: Secondary | ICD-10-CM | POA: Insufficient documentation

## 2020-03-14 DIAGNOSIS — K7460 Unspecified cirrhosis of liver: Secondary | ICD-10-CM | POA: Insufficient documentation

## 2020-03-14 DIAGNOSIS — E785 Hyperlipidemia, unspecified: Secondary | ICD-10-CM | POA: Diagnosis present

## 2020-03-14 DIAGNOSIS — K922 Gastrointestinal hemorrhage, unspecified: Secondary | ICD-10-CM | POA: Diagnosis present

## 2020-03-14 DIAGNOSIS — N183 Chronic kidney disease, stage 3 unspecified: Secondary | ICD-10-CM | POA: Diagnosis present

## 2020-03-14 DIAGNOSIS — E1129 Type 2 diabetes mellitus with other diabetic kidney complication: Secondary | ICD-10-CM | POA: Diagnosis present

## 2020-03-14 DIAGNOSIS — N1832 Chronic kidney disease, stage 3b: Secondary | ICD-10-CM | POA: Insufficient documentation

## 2020-03-14 DIAGNOSIS — K921 Melena: Secondary | ICD-10-CM | POA: Diagnosis not present

## 2020-03-14 HISTORY — PX: ESOPHAGOGASTRODUODENOSCOPY: SHX5428

## 2020-03-14 LAB — RESP PANEL BY RT-PCR (FLU A&B, COVID) ARPGX2
Influenza A by PCR: NEGATIVE
Influenza B by PCR: NEGATIVE
SARS Coronavirus 2 by RT PCR: NEGATIVE

## 2020-03-14 LAB — CBC
HCT: 39.2 % (ref 39.0–52.0)
HCT: 34.9 % — ABNORMAL LOW (ref 39.0–52.0)
HCT: 34.7 % — ABNORMAL LOW (ref 39.0–52.0)
Hemoglobin: 14.2 g/dL (ref 13.0–17.0)
Hemoglobin: 12.2 g/dL — ABNORMAL LOW (ref 13.0–17.0)
Hemoglobin: 12.3 g/dL — ABNORMAL LOW (ref 13.0–17.0)
MCH: 34.1 pg — ABNORMAL HIGH (ref 26.0–34.0)
MCH: 33.5 pg (ref 26.0–34.0)
MCH: 33.9 pg (ref 26.0–34.0)
MCHC: 36.2 g/dL — ABNORMAL HIGH (ref 30.0–36.0)
MCHC: 35 g/dL (ref 30.0–36.0)
MCHC: 35.4 g/dL (ref 30.0–36.0)
MCV: 94.2 fL (ref 80.0–100.0)
MCV: 95.9 fL (ref 80.0–100.0)
MCV: 95.6 fL (ref 80.0–100.0)
Platelets: 106 10*3/uL — ABNORMAL LOW (ref 150–400)
Platelets: 71 10*3/uL — ABNORMAL LOW (ref 150–400)
Platelets: 61 10*3/uL — ABNORMAL LOW (ref 150–400)
RBC: 4.16 MIL/uL — ABNORMAL LOW (ref 4.22–5.81)
RBC: 3.64 MIL/uL — ABNORMAL LOW (ref 4.22–5.81)
RBC: 3.63 MIL/uL — ABNORMAL LOW (ref 4.22–5.81)
RDW: 13.5 % (ref 11.5–15.5)
RDW: 13.8 % (ref 11.5–15.5)
RDW: 13.7 % (ref 11.5–15.5)
WBC: 11.1 10*3/uL — ABNORMAL HIGH (ref 4.0–10.5)
WBC: 8.1 10*3/uL (ref 4.0–10.5)
WBC: 8.2 10*3/uL (ref 4.0–10.5)
nRBC: 0 % (ref 0.0–0.2)
nRBC: 0 % (ref 0.0–0.2)
nRBC: 0 % (ref 0.0–0.2)

## 2020-03-14 LAB — COMPREHENSIVE METABOLIC PANEL
ALT: 32 U/L (ref 0–44)
AST: 34 U/L (ref 15–41)
Albumin: 3.8 g/dL (ref 3.5–5.0)
Alkaline Phosphatase: 82 U/L (ref 38–126)
Anion gap: 14 (ref 5–15)
BUN: 37 mg/dL — ABNORMAL HIGH (ref 8–23)
CO2: 24 mmol/L (ref 22–32)
Calcium: 9.4 mg/dL (ref 8.9–10.3)
Chloride: 103 mmol/L (ref 98–111)
Creatinine, Ser: 1.88 mg/dL — ABNORMAL HIGH (ref 0.61–1.24)
GFR, Estimated: 38 mL/min — ABNORMAL LOW (ref >60)
Glucose, Bld: 150 mg/dL — ABNORMAL HIGH (ref 70–99)
Potassium: 4 mmol/L (ref 3.5–5.1)
Sodium: 141 mmol/L (ref 135–145)
Total Bilirubin: 1.6 mg/dL — ABNORMAL HIGH (ref 0.3–1.2)
Total Protein: 6.5 g/dL (ref 6.5–8.1)

## 2020-03-14 LAB — APTT: aPTT: 26 seconds (ref 24–36)

## 2020-03-14 LAB — GLUCOSE, CAPILLARY: Glucose-Capillary: 95 mg/dL (ref 70–99)

## 2020-03-14 LAB — HIV ANTIBODY (ROUTINE TESTING W REFLEX): HIV Screen 4th Generation wRfx: NONREACTIVE

## 2020-03-14 LAB — PROTIME-INR
INR: 1.3 — ABNORMAL HIGH (ref 0.8–1.2)
Prothrombin Time: 15.5 seconds — ABNORMAL HIGH (ref 11.4–15.2)

## 2020-03-14 LAB — CBG MONITORING, ED: Glucose-Capillary: 104 mg/dL — ABNORMAL HIGH (ref 70–99)

## 2020-03-14 SURGERY — EGD (ESOPHAGOGASTRODUODENOSCOPY)
Anesthesia: General

## 2020-03-14 MED ORDER — PANTOPRAZOLE SODIUM 40 MG IV SOLR: 40.0000 mg | Freq: Two times a day (BID) | INTRAVENOUS | Status: DC

## 2020-03-14 MED ORDER — SODIUM CHLORIDE 0.9 % IV SOLN
1.0000 g | Freq: Once | INTRAVENOUS | Status: AC
  Administered 2020-03-14: 1 g via INTRAVENOUS
  Filled 2020-03-14: qty 10

## 2020-03-14 MED ORDER — IRON-VITAMIN C 65-125 MG PO TABS: 1.0000 | ORAL_TABLET | Freq: Every day | ORAL | Status: DC

## 2020-03-14 MED ORDER — ONDANSETRON HCL 4 MG/2ML IJ SOLN: 4.0000 mg | Freq: Once | INTRAMUSCULAR | Status: DC | PRN

## 2020-03-14 MED ORDER — INSULIN ASPART 100 UNIT/ML ~~LOC~~ SOLN
0.0000 [IU] | Freq: Three times a day (TID) | SUBCUTANEOUS | Status: DC
  Administered 2020-03-15: 3 [IU] via SUBCUTANEOUS
  Filled 2020-03-14: qty 1

## 2020-03-14 MED ORDER — EZETIMIBE 10 MG PO TABS
10.0000 mg | ORAL_TABLET | Freq: Every day | ORAL | Status: DC
  Administered 2020-03-15: 10 mg via ORAL
  Filled 2020-03-14: qty 1

## 2020-03-14 MED ORDER — ATORVASTATIN CALCIUM 80 MG PO TABS
80.0000 mg | ORAL_TABLET | Freq: Every day | ORAL | Status: DC
  Administered 2020-03-14 – 2020-03-15 (×2): 80 mg via ORAL
  Filled 2020-03-14 (×2): qty 1

## 2020-03-14 MED ORDER — SUCCINYLCHOLINE CHLORIDE 20 MG/ML IJ SOLN
INTRAMUSCULAR | Status: DC | PRN
  Administered 2020-03-14: 120 mg via INTRAVENOUS

## 2020-03-14 MED ORDER — LIDOCAINE HCL (PF) 2 % IJ SOLN
INTRAMUSCULAR | Status: AC
  Filled 2020-03-14: qty 5

## 2020-03-14 MED ORDER — ASCORBIC ACID 500 MG PO TABS
1000.0000 mg | ORAL_TABLET | Freq: Every day | ORAL | Status: DC
  Administered 2020-03-15: 1000 mg via ORAL
  Filled 2020-03-14: qty 2

## 2020-03-14 MED ORDER — RIFAXIMIN 550 MG PO TABS
550.0000 mg | ORAL_TABLET | Freq: Two times a day (BID) | ORAL | Status: DC
  Administered 2020-03-14 – 2020-03-15 (×2): 550 mg via ORAL
  Filled 2020-03-14 (×3): qty 1

## 2020-03-14 MED ORDER — NITROGLYCERIN 0.4 MG SL SUBL: 0.4000 mg | SUBLINGUAL_TABLET | SUBLINGUAL | Status: DC | PRN

## 2020-03-14 MED ORDER — SODIUM CHLORIDE 0.9 % IV BOLUS
500.0000 mL | Freq: Once | INTRAVENOUS | Status: AC
  Administered 2020-03-14: 500 mL via INTRAVENOUS

## 2020-03-14 MED ORDER — HYDRALAZINE HCL 20 MG/ML IJ SOLN: 5.0000 mg | INTRAMUSCULAR | Status: DC | PRN

## 2020-03-14 MED ORDER — ONDANSETRON HCL 4 MG/2ML IJ SOLN
4.0000 mg | Freq: Once | INTRAMUSCULAR | Status: AC
  Administered 2020-03-14: 4 mg via INTRAVENOUS
  Filled 2020-03-14: qty 2

## 2020-03-14 MED ORDER — ADULT MULTIVITAMIN W/MINERALS CH
1.0000 | ORAL_TABLET | Freq: Every day | ORAL | Status: DC
  Administered 2020-03-15: 1 via ORAL
  Filled 2020-03-14: qty 1

## 2020-03-14 MED ORDER — INSULIN ASPART 100 UNIT/ML ~~LOC~~ SOLN: 0.0000 [IU] | Freq: Every day | SUBCUTANEOUS | Status: DC

## 2020-03-14 MED ORDER — EPINEPHRINE PF 1 MG/ML IJ SOLN
INTRAMUSCULAR | Status: AC
  Filled 2020-03-14: qty 1

## 2020-03-14 MED ORDER — LACTULOSE 10 GM/15ML PO SOLN
30.0000 g | Freq: Two times a day (BID) | ORAL | Status: DC
  Administered 2020-03-14 – 2020-03-15 (×2): 30 g via ORAL
  Filled 2020-03-14 (×2): qty 60

## 2020-03-14 MED ORDER — SUGAMMADEX SODIUM 200 MG/2ML IV SOLN
INTRAVENOUS | Status: DC | PRN
  Administered 2020-03-14: 200 mg via INTRAVENOUS

## 2020-03-14 MED ORDER — DEXAMETHASONE SODIUM PHOSPHATE 10 MG/ML IJ SOLN
INTRAMUSCULAR | Status: DC | PRN
  Administered 2020-03-14: 10 mg via INTRAVENOUS

## 2020-03-14 MED ORDER — SODIUM CHLORIDE 0.9 % IV SOLN
8.0000 mg/h | INTRAVENOUS | Status: DC
  Administered 2020-03-14: 8 mg/h via INTRAVENOUS
  Filled 2020-03-14 (×2): qty 80

## 2020-03-14 MED ORDER — ICOSAPENT ETHYL 1 G PO CAPS
1.0000 g | ORAL_CAPSULE | Freq: Two times a day (BID) | ORAL | Status: DC
  Administered 2020-03-15: 1 g via ORAL
  Filled 2020-03-14 (×2): qty 1

## 2020-03-14 MED ORDER — INSULIN GLARGINE 100 UNIT/ML ~~LOC~~ SOLN
25.0000 [IU] | Freq: Two times a day (BID) | SUBCUTANEOUS | Status: DC
  Administered 2020-03-14 – 2020-03-15 (×2): 25 [IU] via SUBCUTANEOUS
  Filled 2020-03-14 (×3): qty 0.25

## 2020-03-14 MED ORDER — SODIUM CHLORIDE 0.9 % IV BOLUS
1000.0000 mL | Freq: Once | INTRAVENOUS | Status: AC
  Administered 2020-03-14: 1000 mL via INTRAVENOUS

## 2020-03-14 MED ORDER — FENTANYL CITRATE (PF) 100 MCG/2ML IJ SOLN
INTRAMUSCULAR | Status: AC
  Filled 2020-03-14: qty 2

## 2020-03-14 MED ORDER — OCTREOTIDE LOAD VIA INFUSION
50.0000 ug | Freq: Once | INTRAVENOUS | Status: AC
  Administered 2020-03-14: 50 ug via INTRAVENOUS
  Filled 2020-03-14: qty 25

## 2020-03-14 MED ORDER — ONDANSETRON HCL 4 MG/2ML IJ SOLN
INTRAMUSCULAR | Status: DC | PRN
  Administered 2020-03-14: 4 mg via INTRAVENOUS

## 2020-03-14 MED ORDER — SODIUM CHLORIDE 0.9 % IV SOLN
1.0000 g | INTRAVENOUS | Status: DC
  Filled 2020-03-14: qty 10

## 2020-03-14 MED ORDER — PROPOFOL 10 MG/ML IV BOLUS
INTRAVENOUS | Status: DC | PRN
  Administered 2020-03-14: 150 mg via INTRAVENOUS
  Administered 2020-03-14: 30 mg via INTRAVENOUS

## 2020-03-14 MED ORDER — LIDOCAINE HCL (CARDIAC) PF 100 MG/5ML IV SOSY
PREFILLED_SYRINGE | INTRAVENOUS | Status: DC | PRN
  Administered 2020-03-14: 100 mg via INTRAVENOUS

## 2020-03-14 MED ORDER — DEXAMETHASONE SODIUM PHOSPHATE 10 MG/ML IJ SOLN
INTRAMUSCULAR | Status: AC
  Filled 2020-03-14: qty 1

## 2020-03-14 MED ORDER — FEBUXOSTAT 40 MG PO TABS
80.0000 mg | ORAL_TABLET | Freq: Every day | ORAL | Status: DC
Start: 2020-03-14 — End: 2020-03-15
  Administered 2020-03-14 – 2020-03-15 (×2): 80 mg via ORAL
  Filled 2020-03-14 (×2): qty 2

## 2020-03-14 MED ORDER — HYDROXYZINE HCL 50 MG PO TABS
50.0000 mg | ORAL_TABLET | Freq: Every day | ORAL | Status: DC
  Administered 2020-03-14: 50 mg via ORAL
  Filled 2020-03-14 (×2): qty 1

## 2020-03-14 MED ORDER — ROCURONIUM BROMIDE 100 MG/10ML IV SOLN
INTRAVENOUS | Status: DC | PRN
  Administered 2020-03-14: 5 mg via INTRAVENOUS

## 2020-03-14 MED ORDER — VASOPRESSIN 20 UNIT/ML IV SOLN
INTRAVENOUS | Status: DC | PRN
  Administered 2020-03-14 (×5): 2 [IU] via INTRAVENOUS
  Administered 2020-03-14: 1 [IU] via INTRAVENOUS

## 2020-03-14 MED ORDER — ROCURONIUM BROMIDE 10 MG/ML (PF) SYRINGE
PREFILLED_SYRINGE | INTRAVENOUS | Status: AC
  Filled 2020-03-14: qty 10

## 2020-03-14 MED ORDER — ZINC SULFATE 220 (50 ZN) MG PO CAPS
220.0000 mg | ORAL_CAPSULE | Freq: Two times a day (BID) | ORAL | Status: DC
  Administered 2020-03-15: 220 mg via ORAL
  Filled 2020-03-14: qty 1

## 2020-03-14 MED ORDER — CYCLOSPORINE 0.05 % OP EMUL
1.0000 [drp] | Freq: Two times a day (BID) | OPHTHALMIC | Status: DC
  Administered 2020-03-14 – 2020-03-15 (×2): 1 [drp] via OPHTHALMIC
  Filled 2020-03-14 (×3): qty 1

## 2020-03-14 MED ORDER — SODIUM CHLORIDE 0.9 % IV BOLUS: 500.0000 mL | Freq: Once | INTRAVENOUS | Status: DC

## 2020-03-14 MED ORDER — FENTANYL CITRATE (PF) 100 MCG/2ML IJ SOLN
INTRAMUSCULAR | Status: DC | PRN
  Administered 2020-03-14: 50 ug via INTRAVENOUS

## 2020-03-14 MED ORDER — VASOPRESSIN 20 UNIT/ML IV SOLN
INTRAVENOUS | Status: AC
  Filled 2020-03-14: qty 1

## 2020-03-14 MED ORDER — KETOROLAC TROMETHAMINE 0.5 % OP SOLN
1.0000 [drp] | Freq: Two times a day (BID) | OPHTHALMIC | Status: DC
  Administered 2020-03-14 – 2020-03-15 (×2): 1 [drp] via OPHTHALMIC
  Filled 2020-03-14: qty 3

## 2020-03-14 MED ORDER — SUCCINYLCHOLINE CHLORIDE 200 MG/10ML IV SOSY
PREFILLED_SYRINGE | INTRAVENOUS | Status: AC
  Filled 2020-03-14: qty 10

## 2020-03-14 MED ORDER — PANTOPRAZOLE SODIUM 40 MG IV SOLR
40.0000 mg | Freq: Once | INTRAVENOUS | Status: AC
  Administered 2020-03-14: 40 mg via INTRAVENOUS
  Filled 2020-03-14: qty 40

## 2020-03-14 MED ORDER — FENTANYL CITRATE (PF) 100 MCG/2ML IJ SOLN: 25.0000 ug | INTRAMUSCULAR | Status: DC | PRN

## 2020-03-14 MED ORDER — SODIUM CHLORIDE 0.9 % IV SOLN
50.0000 ug/h | INTRAVENOUS | Status: DC
  Administered 2020-03-14: 50 ug/h via INTRAVENOUS
  Filled 2020-03-14 (×5): qty 1

## 2020-03-14 MED ORDER — SODIUM CHLORIDE 0.9 % IV SOLN: INTRAVENOUS | Status: DC

## 2020-03-14 MED ORDER — PROPOFOL 500 MG/50ML IV EMUL
INTRAVENOUS | Status: AC
  Filled 2020-03-14: qty 50

## 2020-03-14 MED ORDER — ONDANSETRON HCL 4 MG/2ML IJ SOLN
INTRAMUSCULAR | Status: AC
  Filled 2020-03-14: qty 2

## 2020-03-14 MED ORDER — PHENYLEPHRINE HCL (PRESSORS) 10 MG/ML IV SOLN
INTRAVENOUS | Status: DC | PRN
  Administered 2020-03-14: 200 ug via INTRAVENOUS
  Administered 2020-03-14: 100 ug via INTRAVENOUS
  Administered 2020-03-14: 200 ug via INTRAVENOUS
  Administered 2020-03-14: 100 ug via INTRAVENOUS

## 2020-03-14 MED ORDER — EPINEPHRINE 1 MG/10ML IJ SOSY
PREFILLED_SYRINGE | INTRAMUSCULAR | Status: DC | PRN
  Administered 2020-03-14: .005 mg via INTRAVENOUS

## 2020-03-14 MED ORDER — ONDANSETRON HCL 4 MG/2ML IJ SOLN: 4.0000 mg | Freq: Three times a day (TID) | INTRAMUSCULAR | Status: DC | PRN

## 2020-03-14 NOTE — Anesthesia Postprocedure Evaluation (Signed)
Anesthesia Post Note  Patient: Dillon Graves  Procedure(s) Performed: ESOPHAGOGASTRODUODENOSCOPY (EGD) (N/A )  Patient location during evaluation: PACU Anesthesia Type: General Level of consciousness: awake and alert Pain management: pain level controlled Vital Signs Assessment: post-procedure vital signs reviewed and stable Respiratory status: spontaneous breathing and respiratory function stable Cardiovascular status: stable Anesthetic complications: no   No complications documented.   Last Vitals:  Vitals:   03/14/20 1825 03/14/20 1830  BP:  (!) 88/54  Pulse: 62 63  Resp: 14 14  Temp:  36.6 C  SpO2: 100% 100%    Last Pain:  Vitals:   03/14/20 1830  TempSrc:   PainSc: 0-No pain                 Griffith Santilli K

## 2020-03-14 NOTE — ED Notes (Signed)
Report given to endo RN 

## 2020-03-14 NOTE — ED Notes (Signed)
Pt to Endo 

## 2020-03-14 NOTE — Op Note (Signed)
[facility] Patient Name: Dillon Graves Procedure Date: 03/14/2020 4:14 PM MRN: [id number] Account #: [id number] Date of Birth: 01/05/1951 Admit Type: Outpatient Age: 70 Room: [facility] ENDO ROOM 4 Gender: Male Note Status: Finalized Procedure:             Upper GI endoscopy Indications:           Hematemesis, Melena, Cirrhosis with UGI bleeding                         suspected esophageal varices , Portal hypertensive                         gastropathy Providers:             Boykin Nearing. Ellin Fitzgibbons MD, MD Medicines:             Propofol per Anesthesia Complications:         No immediate complications. Estimated blood loss:                         Minimal. Procedure:             Pre-Anesthesia Assessment:                        - The risks and benefits of the procedure and the                         sedation options and risks were discussed with the                         patient. All questions were answered and informed                         consent was obtained.                        - Patient identification and proposed procedure were                         verified prior to the procedure by the nurse. The                         procedure was verified in the procedure room.                        - ASA Grade Assessment: III - A patient with severe                         systemic disease.                        - After reviewing the risks and benefits, the patient                         was deemed in satisfactory condition to undergo the                         procedure.  After obtaining informed consent, the endoscope was                         passed under direct vision. Throughout the procedure,                         the patient's blood pressure, pulse, and oxygen                         saturations were monitored continuously. The Endoscope                         was introduced through the mouth, and advanced to the                          third part of duodenum. The upper GI endoscopy was                         accomplished without difficulty. The patient tolerated                         the procedure well. Findings:      Grade II varices were found in the middle third of the esophagus and in       the lower third of the esophagus. No stigmata of active or recent       bleeding were noted. No cherry red spots, red wale signs or adherent       clots. Estimated blood loss: none.      Severe portal hypertensive gastropathy was found in the entire examined       stomach.      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 Coagulation for hemostasis using argon plasma at 0.8       liters/minute and 30 watts was successful. Estimated blood loss was       minimal.      There is no endoscopic evidence of varices in the cardia.      The examined duodenum was normal.      The exam was otherwise without abnormality. Impression:            - Grade II esophageal varices.                        - Portal hypertensive gastropathy.                        - Gastric antral vascular ectasia with bleeding.                         Treated with argon plasma coagulation (APC).                        - Normal examined duodenum.                        - The examination was otherwise normal.                        - No specimens collected. Recommendation:        - Return  patient to hospital ward for ongoing care.                        - Continue present medications.                        - Continue IV octreotide                        - Clear liquid diet.                        - Check hemoglobin q 8 hours for one day.                        - GI service will continue to follow the patient's                         progress and closely observe for any changes in                         clinical status. Procedure Code(s):     --- Professional ---                         [phone], Esophagogastroduodenoscopy, flexible,                         transoral; with control of bleeding, any method Diagnosis Code(s):     --- Professional ---                        K92.2, Gastrointestinal hemorrhage, unspecified                        K74.60, Unspecified cirrhosis of liver                        K92.1, Melena (includes Hematochezia)                        K92.0, Hematemesis                        K31.811, Angiodysplasia of stomach and duodenum with                         bleeding                        K31.89, Other diseases of stomach and duodenum                        K76.6, Portal hypertension                        I85.10, Secondary esophageal varices without bleeding CPT copyright 2019 [employer] Association. All rights reserved. The codes documented in this report are preliminary and upon coder review may  be revised to meet current compliance requirements. Stanton Kidney MD, MD 03/14/2020 5:10:49 PM This report has been signed electronically. Number of Addenda: 0 Note Initiated On: 03/14/2020 4:14 PM Estimated Blood Loss:  Estimated blood loss was minimal.  [facility]

## 2020-03-14 NOTE — H&P (Addendum)
History and Physical    Dillon Coltathan R Dispenza ZOX:[id number] DOB: 10/07/1950 DOA: 03/14/2020  Referring MD/NP/PA:   PCP: Alan MulderMorayati, Shamil J, MD   Patient coming from:  The patient is coming from home.  At baseline, pt is independent for most of ADL.        Chief Complaint: dark stool and hematemesis  HPI: Dillon Graves is a 70 y.o. male with medical history significant of hypertension, hyperlipidemia, diabetes mellitus, GERD, gout, thrombocytopenia, liver cirrhosis, splenomegaly, CKD stage IIIb, iron deficiency anemia, who presents with dark stool and hematemesis.  Patient states that he was feeling nauseous when he woke up this morning and the had 1 episode of bloody emesis.  Since then, he has had 3 separate bloody bowel movements with very dark stool.  No abdominal pain.  Patient has mild dizziness and lightheadedness.  Denies chest pain, shortness breath, cough, fever or chills.  No symptoms of UTI.  No unilateral numbness or tingling in extremities.  Patient is taking aspirin 81 mg daily.  ED Course: pt was found to have initial Hgb 14.2 -->12.2, WBC 11.1, negative Covid PCR, ammonia level 67, renal function close to baseline, temperature normal, blood pressure 98/80, heart rate 78, RR 17, oxygen saturation 100% on room air.  Patient is placed on progressive bed observation.  Dr. Norma Fredricksonoledo of GI is consulted.  EGD was performed by Dr. Norma Fredricksonoledo  EDG done by Dr. Norma Fredricksonoledo today: - Grade II esophageal varices. - Portal hypertensive gastropathy. - Gastric antral vascular ectasia with bleeding. Treated with argon plasma coagulation (APC). - Normal examined duodenum. - The examination was otherwise normal. - No specimens collected.   Review of Systems:   General: no fevers, chills, no body weight gain, has fatigue HEENT: no blurry vision, hearing changes or sore throat Respiratory: no dyspnea, coughing, wheezing CV: no chest pain, no palpitations GI: has nausea, dark stool and hematemesis, No  abdominal pain, constipation GU: no dysuria, burning on urination, increased urinary frequency, hematuria  Ext: no leg edema Neuro: no unilateral weakness, numbness, or tingling, no vision change or hearing loss.  Has lightheadedness. Skin: no rash, no skin tear. MSK: No muscle spasm, no deformity, no limitation of range of movement in spin Heme: No easy bruising.  Travel history: No recent long distant travel.  Allergy:  Allergies  Allergen Reactions  . Morphine And Related Nausea And Vomiting    Past Medical History:  Diagnosis Date  . Anemia    IDA  . Anesthesia complication   . Arthritis   . Carpal tunnel syndrome   . Carpal tunnel syndrome    RIGHT HAND  . Carpal tunnel syndrome   . Chronic kidney disease    STAGE 3  . Cirrhosis of liver without ascites (HCC)   . Complication of anesthesia    SLOW TO WAKE UP  . Constipation   . Constipation   . Diabetes mellitus without complication (HCC)   . Dyspnea   . Edema   . GERD (gastroesophageal reflux disease)   . Gout   . Gout   . Hypercholesteremia   . Hypertension   . Incoordination   . Ischial bursitis   . Muscle weakness   . Osteoarthritis   . PONV (postoperative nausea and vomiting)   . Portal venous hypertension (HCC)   . Post-operative nausea and vomiting   . Sleep apnea   . SOB (shortness of breath)   . Splenomegaly   . Splenomegaly   . Thrombocytopenia (HCC)   .  Vertigo     Past Surgical History:  Procedure Laterality Date  . CARPAL TUNNEL RELEASE    . CATARACT EXTRACTION W/ INTRAOCULAR LENS IMPLANT Bilateral   . CHOLECYSTECTOMY  07/15/2017  . COLONOSCOPY    . COLONOSCOPY WITH PROPOFOL N/A 11/16/2019   Procedure: COLONOSCOPY WITH PROPOFOL;  Surgeon: Toledo, Boykin Nearing, MD;  Location: [facility];  Service: Gastroenterology;  Laterality: N/A;  . ESOPHAGOGASTRODUODENOSCOPY (EGD) WITH PROPOFOL N/A 06/09/2018   Procedure: ESOPHAGOGASTRODUODENOSCOPY (EGD) WITH PROPOFOL;  Surgeon: Toledo, Boykin Nearing,  MD;  Location: [facility];  Service: Gastroenterology;  Laterality: N/A;  . ESOPHAGOGASTRODUODENOSCOPY (EGD) WITH PROPOFOL N/A 09/22/2018   Procedure: ESOPHAGOGASTRODUODENOSCOPY (EGD) WITH PROPOFOL;  Surgeon: Toledo, Boykin Nearing, MD;  Location: [facility];  Service: Gastroenterology;  Laterality: N/A;  . ESOPHAGOGASTRODUODENOSCOPY (EGD) WITH PROPOFOL N/A 10/20/2018   Procedure: ESOPHAGOGASTRODUODENOSCOPY (EGD) WITH PROPOFOL;  Surgeon: Toledo, Boykin Nearing, MD;  Location: [facility];  Service: Gastroenterology;  Laterality: N/A;  . ESOPHAGOGASTRODUODENOSCOPY (EGD) WITH PROPOFOL N/A 02/23/2019   Procedure: ESOPHAGOGASTRODUODENOSCOPY (EGD) WITH PROPOFOL;  Surgeon: Toledo, Boykin Nearing, MD;  Location: [facility];  Service: Gastroenterology;  Laterality: N/A;  . ESOPHAGOGASTRODUODENOSCOPY (EGD) WITH PROPOFOL N/A 02/08/2020   Procedure: ESOPHAGOGASTRODUODENOSCOPY (EGD) WITH PROPOFOL;  Surgeon: Toledo, Boykin Nearing, MD;  Location: [facility];  Service: Gastroenterology;  Laterality: N/A;  . EYE SURGERY    . JOINT REPLACEMENT Left    Hip  . JOINT REPLACEMENT Right    Knee  . LIVER BIOPSY    . ROTATOR CUFF REPAIR      Social History:  reports that he has never smoked. He has never used smokeless tobacco. He reports previous drug use. He reports that he does not drink alcohol.  Family History:  Family History  Problem Relation Age of Onset  . Cancer Father   . Heart attack Brother      Prior to Admission medications   Medication Sig Start Date End Date Taking? Authorizing Provider  aspirin 81 MG tablet Take 81 mg by mouth daily.    [provider]  atorvastatin (LIPITOR) 40 MG tablet Take 40 mg by mouth daily.    [provider]  Bromfenac Sodium (PROLENSA) 0.07 % SOLN Apply 1 drop to eye daily.    [provider]  cycloSPORINE (RESTASIS) 0.05 % ophthalmic emulsion Place 1 drop into both eyes 2 (two) times daily.    [provider]  docusate sodium  (COLACE) 100 MG capsule Take 100 mg by mouth 2 (two) times daily.    [provider]  empagliflozin (JARDIANCE) 25 MG TABS tablet Take 25 mg by mouth daily.    [provider]  ergocalciferol (VITAMIN D2) 50000 UNITS capsule Take 50,000 Units by mouth once a week.    [provider]  ezetimibe (ZETIA) 10 MG tablet Take 10 mg by mouth daily.    [provider]  febuxostat (ULORIC) 40 MG tablet Take 40 mg by mouth daily.    [provider]  Ferrous Fumarate (HEMOCYTE - 106 MG FE) 324 (106 Fe) MG TABS tablet Take 1 tablet by mouth daily.    [provider]  furosemide (LASIX) 80 MG tablet Take 80 mg by mouth daily.    [provider]  insulin glargine (LANTUS) 100 UNIT/ML injection Inject 60 Units into the skin at bedtime.    [provider]  lactulose (CHRONULAC) 10 GM/15ML solution Take 30 g by mouth 2 (two) times daily as needed for mild constipation.    [provider]  lisinopril-hydrochlorothiazide (PRINZIDE,ZESTORETIC) 20-25 MG tablet Take 1 tablet by mouth daily.    [provider]  Multiple Vitamin (MULTIVITAMIN) tablet Take 1 tablet by mouth daily.    [provider]  nadolol (CORGARD) 40 MG tablet Take 40 mg by mouth daily.    [provider]  nitroGLYCERIN (NITROSTAT) 0.4 MG SL tablet Place 0.4 mg under the tongue every 5 (five) minutes as needed for chest pain.    [provider]  omeprazole (PRILOSEC) 40 MG capsule Take 40 mg by mouth daily.    [provider]  ondansetron (ZOFRAN ODT) 8 MG disintegrating tablet Take 1 tablet (8 mg total) by mouth 2 (two) times daily. 11/14/14   Payton Mccallum, MD  rifaximin (XIFAXAN) 550 MG TABS tablet Take 550 mg by mouth 2 (two) times daily.    [provider]  sitaGLIPtin-metformin (JANUMET) 50-1000 MG tablet Take 1 tablet by mouth 2 (two) times daily with a meal.    [provider]  spironolactone (ALDACTONE)  25 MG tablet Take 25 mg by mouth daily.    [provider]    Physical Exam: Vitals:   03/14/20 1820 03/14/20 1825 03/14/20 1830 03/14/20 1835  BP: (!) 85/56  (!) 88/54   Pulse: 62 62 63 64  Resp: Temp:   97.9 F (36.6 C)   TempSrc:      SpO2: 97% 100% 100% 100%  Weight:      Height:       General: Not in acute distress HEENT:       Eyes: PERRL, EOMI, no scleral icterus.       ENT: No discharge from the ears and nose,      Neck: No JVD, no bruit, no mass felt. Heme: No neck lymph node enlargement. Cardiac: S1/S2, RRR, No murmurs, No gallops or rubs. Respiratory: No rales, wheezing, rhonchi or rubs. GI: Soft, nondistended, nontender, no rebound pain, BS present. GU: No hematuria Ext: No pitting leg edema bilaterally. 1+DP/PT pulse bilaterally. Musculoskeletal: No joint deformities, No joint redness or warmth, no limitation of ROM in spin. Skin: No rashes.  Neuro: Alert, oriented X3, cranial nerves II-XII grossly intact, moves all extremities normally.  Psych: Patient is not psychotic, no suicidal or hemocidal ideation.  Labs on Admission: I have personally reviewed following labs and imaging studies  CBC: Recent Labs  Lab 03/14/20 1333 03/14/20 1813  WBC 11.1* 8.1  HGB 14.2 12.2*  HCT 39.2 34.9*  MCV 94.2 95.9  PLT 106* 71*   Basic Metabolic Panel: Recent Labs  Lab 03/14/20 1333  NA 141  K 4.0  CL 103  CO2 24  GLUCOSE 150*  BUN 37*  CREATININE 1.88*  CALCIUM 9.4   GFR: Estimated Creatinine Clearance: 42.4 mL/min (A) (by C-G formula based on SCr of 1.88 mg/dL (H)). Liver Function Tests: Recent Labs  Lab 03/14/20 1333  AST 34  ALT 32  ALKPHOS 82  BILITOT 1.6*  PROT 6.5  ALBUMIN 3.8   No results for input(s): LIPASE, AMYLASE in the last 168 hours. No results for input(s): AMMONIA in the last 168 hours. Coagulation Profile: Recent Labs  Lab 03/14/20 1333  INR 1.3*   Cardiac Enzymes: No results for input(s): CKTOTAL,  CKMB, CKMBINDEX, TROPONINI in the last 168 hours. BNP (last 3 results) No results for input(s): PROBNP in the last 8760 hours. HbA1C: No results for input(s): HGBA1C in the last 72 hours. CBG: Recent Labs  Lab 03/14/20  1615  GLUCAP 104*   Lipid Profile: No results for input(s): CHOL, HDL, LDLCALC, TRIG, CHOLHDL, LDLDIRECT in the last 72 hours. Thyroid Function Tests: No results for input(s): TSH, T4TOTAL, FREET4, T3FREE, THYROIDAB in the last 72 hours. Anemia Panel: No results for input(s): VITAMINB12, FOLATE, FERRITIN, TIBC, IRON, RETICCTPCT in the last 72 hours. Urine analysis:    Component Value Date/Time   COLORURINE STRAW 10/27/2013 0815   APPEARANCEUR CLEAR 10/27/2013 0815   LABSPEC 1.010 10/27/2013 0815   PHURINE 5.0 10/27/2013 0815   GLUCOSEU NEGATIVE 10/27/2013 0815   HGBUR NEGATIVE 10/27/2013 0815   BILIRUBINUR NEGATIVE 10/27/2013 0815   KETONESUR NEGATIVE 10/27/2013 0815   PROTEINUR NEGATIVE 10/27/2013 0815   NITRITE NEGATIVE 10/27/2013 0815   LEUKOCYTESUR NEGATIVE 10/27/2013 0815   Sepsis Labs: @LABRCNTIP (procalcitonin:4,lacticidven:4) ) Recent Results (from the past 240 hour(s))  Resp Panel by RT-PCR (Flu A&B, Covid) Nasopharyngeal Swab     Status: None   Collection Time: 03/14/20  2:52 PM   Specimen: Nasopharyngeal Swab; Nasopharyngeal(NP) swabs in vial transport medium  Result Value Ref Range Status   SARS Coronavirus 2 by RT PCR NEGATIVE NEGATIVE Final    Comment: (NOTE) SARS-CoV-2 target nucleic acids are NOT DETECTED.  The SARS-CoV-2 RNA is generally detectable in upper respiratory specimens during the acute phase of infection. The lowest concentration of SARS-CoV-2 viral copies this assay can detect is 138 copies/mL. A negative result does not preclude SARS-Cov-2 infection and should not be used as the sole basis for treatment or other patient management decisions. A negative result may occur with  improper specimen collection/handling, submission  of specimen other than nasopharyngeal swab, presence of viral mutation(s) within the areas targeted by this assay, and inadequate number of viral copies(<138 copies/mL). A negative result must be combined with clinical observations, patient history, and epidemiological information. The expected result is Negative.  Fact Sheet for Patients:  05/14/20  Fact Sheet for Healthcare Providers:  BloggerCourse.com  This test is no t yet approved or cleared by the [employer] FDA and  has been authorized for detection and/or diagnosis of SARS-CoV-2 by FDA under an Emergency Use Authorization (EUA). This EUA will remain  in effect (meaning this test can be used) for the duration of the COVID-19 declaration under Section 564(b)(1) of the Act, 21 U.S.C.section 360bbb-3(b)(1), unless the authorization is terminated  or revoked sooner.       Influenza A by PCR NEGATIVE NEGATIVE Final   Influenza B by PCR NEGATIVE NEGATIVE Final    Comment: (NOTE) The Xpert Xpress SARS-CoV-2/FLU/RSV plus assay is intended as an aid in the diagnosis of influenza from Nasopharyngeal swab specimens and should not be used as a sole basis for treatment. Nasal washings and aspirates are unacceptable for Xpert Xpress SARS-CoV-2/FLU/RSV testing.  Fact Sheet for Patients: [place]  Fact Sheet for Healthcare Providers: BloggerCourse.com  This test is not yet approved or cleared by the [employer].[place] FDA and has been authorized for detection and/or diagnosis of SARS-CoV-2 by FDA under an Emergency Use Authorization (EUA). This EUA will remain in effect (meaning this test can be used) for the duration of the COVID-19 declaration under Section 564(b)(1) of the Act, 21 U.S.C. section 360bbb-3(b)(1), unless the authorization is terminated or revoked.  Performed at [facility], [address] [place], [place]      Radiological Exams on Admission: No results found.   EKG: I have personally reviewed.  Sinus rhythm, QTC 500, low voltage, LAD, poor R wave progression  Assessment/Plan Principal Problem:   GI bleeding Active Problems:   Iron deficiency anemia   GERD (gastroesophageal reflux disease)   Cirrhosis of liver without ascites (HCC)   Gout   Thrombocytopenia (HCC)   Type II diabetes mellitus with renal manifestations (HCC)   CKD (chronic kidney disease), stage IIIb   HLD (hyperlipidemia)   GI bleeding: Hgb 14.2 -->12.2.  Dr. [name] of GI is consulted, EGD was performed today.  Which showed grade II esophageal varices, portal hypertensive gastropathy, gastric antral vascular ectasia with bleeding. Treated with argon plasma coagulation (APC). After the procedure, pt's bp dropped to 78/55, improving with IVF -->85/56 --> 101/61  - Placed on progressive unit for observation - IVF: 1.5 L NS bolus, then at 100 mL/hr - Start IV pantoprazole gtt - start Octreotide gtt -Started Rocephin empirically - Zofran IV for nausea - Avoid NSAIDs and SQ heparin - Maintain IV access (2 large bore IVs if possible). - Monitor closely and follow q6h cbc, transfuse as necessary, if Hgb<7.0 - LaB: INR, PTT and type screen -Hold aspirin  Ion deficiency anemia -Continue iron supplement  GERD (gastroesophageal reflux disease) -On Protonix drip  Cirrhosis of liver without ascites (HCC): Ammonia level 67 -Continue lactulose -Hold Lasix, metoprolol, spironolactone due to hypotension  Gout -Uloric  Thrombocytopenia (HCC): Most likely due to liver cirrhosis.  Platelet of 106 -Follow-up by CBC  Type II diabetes mellitus with renal manifestations (HCC): No A1c on record.  Blood sugar 150.  Patient is taking glargine insulin, Janumet, Jardiance and Ozempic -Sliding scale insulin -Decrease glargine insulin dose from 50 in AM and 25-50 unit in PM to 25 units bid  CKD  (chronic kidney disease), stage IIIb: close to baseline.  Baseline creatinine 1.5-1.8.  His creatinine is 1.88, BUN 37 -Follow-up BMP  HLD (hyperlipidemia) -Zetia      DVT ppx: ASCD Code Status: Full code Family Communication:  Yes, patient's wife   at bed side Disposition Plan:  Anticipate discharge back to previous environment Consults called:  Dr. [name] for GI Admission status and Level of care: :   progressive unit for obs    Status is: Observation  The patient remains OBS appropriate and will d/c before 2 midnights.  Dispo: The patient is from: Home              Anticipated d/c is to: Home              Patient currently is not medically stable to d/c.   Difficult to place patient No            Date of Service 03/14/2020    Lorretta Harp [facility]   If 7PM-7AM, please contact night-coverage [email or web address] 03/14/2020, 6:45 PM

## 2020-03-14 NOTE — Anesthesia Preprocedure Evaluation (Signed)
Anesthesia Evaluation  Patient identified by MRN, date of birth, ID band Patient awake  General Assessment Comment:Concern for variceal bleed. Last vomited blood this morning. Belching on exam today  Reviewed: Allergy & Precautions, H&P , NPO status , Patient's Chart, lab work & pertinent test results  History of Anesthesia Complications (+) PONV and history of anesthetic complications  Airway Mallampati: II  TM Distance: >3 FB Neck ROM: Full    Dental  (+) Teeth Intact   Pulmonary shortness of breath and with exertion, sleep apnea ,    Pulmonary exam normal breath sounds clear to auscultation       Cardiovascular Exercise Tolerance: Poor hypertension, Normal cardiovascular exam Rhythm:Regular Rate:Normal - Systolic murmurs    Neuro/Psych CTS  Neuromuscular disease negative psych ROS   GI/Hepatic GERD  ,(+) Cirrhosis   Esophageal Varices    ,   Endo/Other  diabetes  Renal/GU CRFRenal disease  negative genitourinary   Musculoskeletal  (+) Arthritis ,   Abdominal (+)  Abdomen: soft.    Peds negative pediatric ROS (+)  Hematology negative hematology ROS (+)   Anesthesia Other Findings Past Medical History: No date: Anemia     Comment:  IDA No date: Anesthesia complication No date: Arthritis No date: Carpal tunnel syndrome No date: Carpal tunnel syndrome     Comment:  RIGHT HAND No date: Carpal tunnel syndrome No date: Chronic kidney disease     Comment:  STAGE 3 No date: Cirrhosis of liver without ascites (HCC) No date: Complication of anesthesia     Comment:  SLOW TO WAKE UP No date: Constipation No date: Constipation No date: Diabetes mellitus without complication (HCC) No date: Dyspnea No date: Edema No date: GERD (gastroesophageal reflux disease) No date: Gout No date: Gout No date: Hypercholesteremia No date: Hypertension No date: Incoordination No date: Ischial bursitis No date: Muscle  weakness No date: Osteoarthritis No date: PONV (postoperative nausea and vomiting) No date: Portal venous hypertension (HCC) No date: Post-operative nausea and vomiting No date: Sleep apnea No date: SOB (shortness of breath) No date: Splenomegaly No date: Splenomegaly No date: Thrombocytopenia (HCC) No date: Vertigo   Reproductive/Obstetrics negative OB ROS                             Anesthesia Physical  Anesthesia Plan  ASA: III  Anesthesia Plan: General   Post-op Pain Management:    Induction: Intravenous and Rapid sequence  PONV Risk Score and Plan: 3 and Ondansetron and Dexamethasone  Airway Management Planned: Oral ETT and Video Laryngoscope Planned  Additional Equipment: None  Intra-op Plan:   Post-operative Plan: Extubation in OR  Informed Consent: I have reviewed the patients History and Physical, chart, labs and discussed the procedure including the risks, benefits and alternatives for the proposed anesthesia with the patient or authorized representative who has indicated his/her understanding and acceptance.     Dental advisory given  Plan Discussed with: CRNA, Anesthesiologist and Surgeon  Anesthesia Plan Comments: (Discussed risks of anesthesia with patient, including PONV, sore throat, aspiration, lip/dental damage. Rare risks discussed as well, such as cardiorespiratory and neurological sequelae. Patient understands. Patient counseled on being higher risk for anesthesia due to comorbidities: active upper GI bleed, cirrhosis. Patient was told about increased risk of cardiac and respiratory events, including death. Patient understands. )        Anesthesia Quick Evaluation

## 2020-03-14 NOTE — Consult Note (Signed)
GI Inpatient Consult Note  Reason for Consult: GI Bleed   Attending Requesting Consult: Dr. Chesley Noon, MD  History of Present Illness: Dillon Graves is a 70 y.o. male seen for evaluation of hematemesis and hematochezia at the request of Dr. Chesley Noon. Pt has a PMH of HTN, HLD, diabetes, GERD, CKD Stage IIIb, and cryptogenic cirrhosis. He is a very well known patient in the outpatient setting by Dr. Norma Graves and myself. Patient's wife called our office this morning with bleeding concerns. Patient reports he had one episode of hematemesis this morning after he woke up around 0600 this morning. He denies seeing any coffee-grounds, he reports he saw dark red blood. There has just been one episode of hematemesis; however, he has had four total episodes of hematochezia. He reports first episode was 0800 this morning and last episode was around 1130 this morning just prior to coming to the ED. He actually went to work this morning for 3 hours and didn't tell his wife. He had another episode of hematochezia at work and he got concerned and went home. He reports hematochezia started out as dark-colored and maroon and has gotten lighter each time. He denies any abdominal pain. He does endorse nausea and generalized weakness. He denies any confusion or failure to remember names, things, or sentences. He had iron infusion at the [facility] on Monday and reports he felt the best he had in a long time yesterday. He has not been using NSAIDs or alcohol. He had follow-up of esophageal varices with EGD 02/08/2020 with Dr. Norma Graves showing Grade I esophageal varices, severe portal hypertensive gastropathy found in stomach, moderate GAVE with bleeding s/p APC, one 15 mm mucosal papule with no bleeding in prepyloric region of stomach, and normal duodenum. He presented to the [facility] ED this afternoon and was found to be hemodynamically stable with no drop in hemoglobin. He was started on octreotide bolus and drip, IV  Protonix, and Rocephin. GI consulted for acute GI bleeding.     Summary of GI Procedures:  EGD 02/08/20           - Grade I esophageal varices. - Portal hypertensive gastropathy. 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 Biopsied. - Normal examined duodenum.  Other EGDs: - EGD 11/2016 - Gr I varices - EGD:06/09/2018 -Gr III varices, banded x 3. - EGD: 09/22/2018- Gr III varices, banded x3 - EGD: 10/20/2018 - Gr II varices, banded x3 - EGD 02/23/2019 - Gr I varices with no stigmata of bleeding, no banding, severe PHG    Past Medical History:  Past Medical History:  Diagnosis Date  . Anemia    IDA  . Anesthesia complication   . Arthritis   . Carpal tunnel syndrome   . Carpal tunnel syndrome    RIGHT HAND  . Carpal tunnel syndrome   . Chronic kidney disease    STAGE 3  . Cirrhosis of liver without ascites (HCC)   . Complication of anesthesia    SLOW TO WAKE UP  . Constipation   . Constipation   . Diabetes mellitus without complication (HCC)   . Dyspnea   . Edema   . GERD (gastroesophageal reflux disease)   . Gout   . Gout   . Hypercholesteremia   . Hypertension   . Incoordination   . Ischial bursitis   . Muscle weakness   . Osteoarthritis   . PONV (postoperative nausea and vomiting)   .  Portal venous hypertension (HCC)   . Post-operative nausea and vomiting   . Sleep apnea   . SOB (shortness of breath)   . Splenomegaly   . Splenomegaly   . Thrombocytopenia (HCC)   . Vertigo     Problem List: Patient Active Problem List   Diagnosis Date Noted  . GI bleeding 03/14/2020  . Iron deficiency anemia 03/14/2020  . GERD (gastroesophageal reflux disease)   . Cirrhosis of liver without ascites (HCC)   . Gout   . Thrombocytopenia (HCC)   . Type II diabetes mellitus with renal manifestations (HCC)   . CKD (chronic kidney disease), stage IIIb   . HLD (hyperlipidemia)     Past Surgical  History: Past Surgical History:  Procedure Laterality Date  . CARPAL TUNNEL RELEASE    . CATARACT EXTRACTION W/ INTRAOCULAR LENS IMPLANT Bilateral   . CHOLECYSTECTOMY  07/15/2017  . COLONOSCOPY    . COLONOSCOPY WITH PROPOFOL N/A 11/16/2019   Procedure: COLONOSCOPY WITH PROPOFOL;  Surgeon: Toledo, Boykin Nearing, MD;  Location: [facility] ENDOSCOPY;  Service: Gastroenterology;  Laterality: N/A;  . ESOPHAGOGASTRODUODENOSCOPY (EGD) WITH PROPOFOL N/A 06/09/2018   Procedure: ESOPHAGOGASTRODUODENOSCOPY (EGD) WITH PROPOFOL;  Surgeon: Toledo, Boykin Nearing, MD;  Location: [facility] ENDOSCOPY;  Service: Gastroenterology;  Laterality: N/A;  . ESOPHAGOGASTRODUODENOSCOPY (EGD) WITH PROPOFOL N/A 09/22/2018   Procedure: ESOPHAGOGASTRODUODENOSCOPY (EGD) WITH PROPOFOL;  Surgeon: Toledo, Boykin Nearing, MD;  Location: [facility] ENDOSCOPY;  Service: Gastroenterology;  Laterality: N/A;  . ESOPHAGOGASTRODUODENOSCOPY (EGD) WITH PROPOFOL N/A 10/20/2018   Procedure: ESOPHAGOGASTRODUODENOSCOPY (EGD) WITH PROPOFOL;  Surgeon: Toledo, Boykin Nearing, MD;  Location: [facility] ENDOSCOPY;  Service: Gastroenterology;  Laterality: N/A;  . ESOPHAGOGASTRODUODENOSCOPY (EGD) WITH PROPOFOL N/A 02/23/2019   Procedure: ESOPHAGOGASTRODUODENOSCOPY (EGD) WITH PROPOFOL;  Surgeon: Toledo, Boykin Nearing, MD;  Location: [facility] ENDOSCOPY;  Service: Gastroenterology;  Laterality: N/A;  . ESOPHAGOGASTRODUODENOSCOPY (EGD) WITH PROPOFOL N/A 02/08/2020   Procedure: ESOPHAGOGASTRODUODENOSCOPY (EGD) WITH PROPOFOL;  Surgeon: Toledo, Boykin Nearing, MD;  Location: [facility] ENDOSCOPY;  Service: Gastroenterology;  Laterality: N/A;  . EYE SURGERY    . JOINT REPLACEMENT Left    Hip  . JOINT REPLACEMENT Right    Knee  . LIVER BIOPSY    . ROTATOR CUFF REPAIR      Allergies: Allergies  Allergen Reactions  . Morphine And Related Nausea And Vomiting    Home Medications: Medications Prior to Admission  Medication Sig Dispense Refill Last Dose  . Ascorbic Acid (VITAMIN C) 1000 MG tablet Take 1,000 mg by  mouth daily.     [name] aspirin 81 MG tablet Take 81 mg by mouth daily.     [name] atorvastatin (LIPITOR) 80 MG tablet Take 80 mg by mouth daily.   03/13/2020 at Unknown  . Bromfenac Sodium (PROLENSA) 0.07 % SOLN Place 1 drop into both eyes 2 (two) times daily.   03/14/2020 at 0700  . cycloSPORINE (RESTASIS) 0.05 % ophthalmic emulsion Place 1 drop into both eyes 2 (two) times daily.   03/14/2020 at 0700  . empagliflozin (JARDIANCE) 25 MG TABS tablet Take 25 mg by mouth daily.   03/14/2020 at 0700  . ergocalciferol (VITAMIN D2) 50000 UNITS capsule Take 50,000 Units by mouth every Saturday.   03/10/2020 at Unknown  . ezetimibe (ZETIA) 10 MG tablet Take 10 mg by mouth daily.   03/14/2020 at 0700  . Febuxostat 80 MG TABS Take 80 mg by mouth daily.   03/13/2020 at Unknown  . furosemide (LASIX) 80 MG tablet Take 40-80 mg by mouth See admin instructions. Take 1 tablet (  80mg ) by mouth every morning and take  tablet (40mg ) by mouth every afternoon   03/14/2020 at 0700  . hydrOXYzine (ATARAX/VISTARIL) 50 MG tablet Take 50 mg by mouth at bedtime.   03/13/2020 at 2100  . Insulin Glargine (BASAGLAR KWIKPEN) 100 UNIT/ML Inject 25-50 Units into the skin See admin instructions. Inject 50u under the skin every morning and inject 25u to 50u under the skin every night based upon blood glucose reading   03/14/2020 at 0700  . Iron-Vitamin C 65-125 MG TABS Take 1 tablet by mouth as directed.     . lactulose (CHRONULAC) 10 GM/15ML solution Take 30 g by mouth 2 (two) times daily.   03/14/2020 at 0700  . Milk Thistle 500 MG CAPS Take 1 capsule by mouth as directed.     . Multiple Vitamin (MULTIVITAMIN) tablet Take 1 tablet by mouth daily.     . nadolol (CORGARD) 40 MG tablet Take 40 mg by mouth daily.   03/14/2020 at 0700  . nitroGLYCERIN (NITROSTAT) 0.4 MG SL tablet Place 0.4 mg under the tongue every 5 (five) minutes as needed for chest pain.   Unknown at PRN  . omeprazole (PRILOSEC) 40 MG capsule Take 40 mg by mouth 2 (two) times daily.   03/14/2020 at  0700  . ondansetron (ZOFRAN ODT) 8 MG disintegrating tablet Take 1 tablet (8 mg total) by mouth 2 (two) times daily. 6 tablet 0 Unknown at PRN  . OZEMPIC, 1 MG/DOSE, 4 MG/3ML SOPN Inject 1 mg into the skin every Saturday.   03/10/2020 at Unknown  . rifaximin (XIFAXAN) 550 MG TABS tablet Take 550 mg by mouth 2 (two) times daily.   03/14/2020 at 0700  . SitaGLIPtin-MetFORMIN HCl (JANUMET XR) 304 839 7988 MG TB24 Take 1 tablet by mouth 2 (two) times daily.   03/14/2020 at 0700  . spironolactone (ALDACTONE) 50 MG tablet Take 25 mg by mouth daily.   03/13/2020 at Unknown  . VASCEPA 1 g capsule Take 1 g by mouth 2 (two) times daily.   03/14/2020 at 0700  . Zinc 50 MG TABS Take 100 mg by mouth 2 (two) times daily.      Home medication reconciliation was completed with the patient.   Scheduled Inpatient Medications:   . [MAR Hold] insulin aspart  0-5 Units Subcutaneous QHS  . [MAR Hold] insulin aspart  0-9 Units Subcutaneous TID WC  . [MAR Hold] octreotide  50 mcg Intravenous Once  . [MAR Hold] pantoprazole  40 mg Intravenous Q12H    Continuous Inpatient Infusions:   . [MAR Hold] cefTRIAXone (ROCEPHIN)  IV    . [MAR Hold] octreotide  (SANDOSTATIN)    IV infusion    . pantoprozole (PROTONIX) infusion      PRN Inpatient Medications:    Family History: family history includes Cancer in his father; Heart attack in his brother.  The patient's family history is negative for inflammatory bowel disorders, GI malignancy, or solid organ transplantation.  Social History:   reports that he has never smoked. He has never used smokeless tobacco. He reports previous drug use. He reports that he does not drink alcohol. The patient denies ETOH, tobacco, or drug use.   Review of Systems: Constitutional: Weight is stable, +poor appetite, +nausea Eyes: No changes in vision. ENT: No oral lesions, sore throat.  GI: see HPI.  Heme/Lymph: No easy bruising.  CV: No chest pain.  GU: No hematuria.  Integumentary: No  rashes.  Neuro: No headaches.  Psych: No depression/anxiety.  Endocrine:  No heat/cold intolerance.  Allergic/Immunologic: No urticaria.  Resp: No cough, SOB.  Musculoskeletal: No joint swelling.    Physical Examination: BP (!) 81/57   Pulse 70   Temp 97.7 F (36.5 C) (Oral)   Resp 16   Ht 5\' 9"  (1.753 m)   Wt 96.2 kg   SpO2 98%   BMI 31.31 kg/m   Answers all questions appropriately. Wife present in room.  Gen: NAD, alert and oriented x 4 HEENT: PEERLA, EOMI, Neck: supple, no JVD or thyromegaly Chest: CTA bilaterally, no wheezes, crackles, or other adventitious sounds CV: RRR, no m/g/c/r Abd: soft, NT, ND, +BS in all four quadrants; no HSM, guarding, ridigity, or rebound tenderness Ext: no edema, well perfused with 2+ pulses, Skin: no rash or lesions noted Lymph: no LAD  Data: Lab Results  Component Value Date   WBC 11.1 (H) 03/14/2020   HGB 14.2 03/14/2020   HCT 39.2 03/14/2020   MCV 94.2 03/14/2020   PLT 106 (L) 03/14/2020   Recent Labs  Lab 03/14/20 1333  HGB 14.2   Lab Results  Component Value Date   NA 141 03/14/2020   K 4.0 03/14/2020   CL 103 03/14/2020   CO2 24 03/14/2020   BUN 37 (H) 03/14/2020   CREATININE 1.88 (H) 03/14/2020   Lab Results  Component Value Date   ALT 32 03/14/2020   AST 34 03/14/2020   ALKPHOS 82 03/14/2020   BILITOT 1.6 (H) 03/14/2020   Recent Labs  Lab 03/14/20 1333  APTT 26  INR 1.3*   Assessment/Plan:  70 y/o Caucasian male with a PMH of HTN, HLD, diabetes, GERD, CKD Stage IIIb, and cryptogenic cirrhosis [facility] for concerns over acute GI bleeding in the form of one episode of frank hematemesis and four episodes of painless hematochezia   1. Acute GI Bleeding - DDx includes variceal bleeding, GAVE, peptic ulcer disease, gastritis, erosive esophagitis, AVMs, Dieulafoy's lesion, small bowel source, lower GI bleeding, etc  2. Grade I esophageal varices - recent elective EGD 02/08/20  3. GAVE  with bleeding s/p APC 02/08/20  4. Moderate portal hypertensive gastropathy  5. Hepatic encephalopathy - no overt HE, on home regimen of Lactulose 30 cc PO BID and Rifaximin 550 mg PO BID  6. Thrombocytopenia - 106K  7. CKD Stage IIIb - followed by [facility]  Recommendations:  - Agree with Octreotide bolus and drip, IV Protonix, and Rocephin - Concern for UGI bleeding given his history of esophageal varices requiring banding and GAVE s/p recent treatment with APC - Hemodynamically stable currently with H&H within normal limits - Advise emergent EGD this afternoon with Dr. Norma Fredricksonoledo for luminal evaluation and potential endoscopic hemostasis - Rapid COVID-19 test negative - Plan for EGD this afternoon with Dr. Norma Fredricksonoledo - See procedure note for findings and further recommendations  I reviewed the risks (including bleeding, perforation, infection, anesthesia complications, cardiac/respiratory complications), benefits and alternatives of EGD. Patient consents to proceed.    Thank you for the consult. Please call with questions or concerns.  Mickle MalloryGranville M Karole Oo, PA-C [facility] [phone] (Cell)

## 2020-03-14 NOTE — Transfer of Care (Signed)
Immediate Anesthesia Transfer of Care Note  Patient: Dillon Graves  Procedure(s) Performed: ESOPHAGOGASTRODUODENOSCOPY (EGD) (N/A )  Patient Location: PACU  Anesthesia Type:General  Level of Consciousness: drowsy and patient cooperative  Airway & Oxygen Therapy: Patient Spontanous Breathing and Patient connected to face mask oxygen  Post-op Assessment: Report given to RN and Post -op Vital signs reviewed and stable  Post vital signs: Reviewed and stable  Last Vitals:  Vitals Value Taken Time  BP 99/63 03/14/20 1710  Temp    Pulse 59 03/14/20 1710  Resp 18 03/14/20 1710  SpO2 100 % 03/14/20 1710  Vitals shown include unvalidated device data.  Last Pain:  Vitals:   03/14/20 1609  TempSrc: Temporal  PainSc:          Complications: No complications documented.

## 2020-03-14 NOTE — ED Provider Notes (Addendum)
[facility] Emergency Department Provider Note   ____________________________________________   Event Date/Time   First MD Initiated Contact with Patient 03/14/20 1419     (approximate)  I have reviewed the triage vital signs and the nursing notes.   HISTORY  Chief Complaint Rectal Bleeding and Hematemesis    HPI Dillon Graves is a 70 y.o. male with past medical history of hypertension, hyperlipidemia, diabetes, GERD, CKD, and cirrhosis who presents to the ED with hematemesis and bloody stools.  Patient reports that he was feeling nauseous when he woke up this morning had 1 episode of bloody emesis.  He then was able to go to work but continued to feel nauseous and weak while there.  Since then, he has had 3 separate bloody bowel movements, states they were initially very dark in color but have since become lighter with obvious blood.  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  He has had no further hematemesis since the episode this morning.        Past Medical History:  Diagnosis Date  . Anemia    IDA  . Anesthesia complication   . Arthritis   . Carpal tunnel syndrome   . Carpal tunnel syndrome    RIGHT HAND  . Carpal tunnel syndrome   . Chronic kidney disease    STAGE 3  . Cirrhosis of liver without ascites (HCC)   . Complication of anesthesia    SLOW TO WAKE UP  . Constipation   . Constipation   . Diabetes mellitus without complication (HCC)   . Dyspnea   . Edema   . GERD (gastroesophageal reflux disease)   . Gout   . Gout   . Hypercholesteremia   . Hypertension   . Incoordination   . Ischial bursitis   . Muscle weakness   . Osteoarthritis   . PONV (postoperative nausea and vomiting)   . Portal venous hypertension (HCC)   . Post-operative nausea and vomiting   . Sleep apnea   . SOB (shortness of breath)   . Splenomegaly   . Splenomegaly   .  Thrombocytopenia (HCC)   . Vertigo     There are no problems to display for this patient.   Past Surgical History:  Procedure Laterality Date  . CARPAL TUNNEL RELEASE    . CATARACT EXTRACTION W/ INTRAOCULAR LENS IMPLANT Bilateral   . CHOLECYSTECTOMY  07/15/2017  . COLONOSCOPY    . COLONOSCOPY WITH PROPOFOL N/A 11/16/2019   Procedure: COLONOSCOPY WITH PROPOFOL;  Surgeon: Toledo, Boykin Nearingeodoro K, MD;  Location: [facility] ENDOSCOPY;  Service: Gastroenterology;  Laterality: N/A;  . ESOPHAGOGASTRODUODENOSCOPY (EGD) WITH PROPOFOL N/A 06/09/2018   Procedure: ESOPHAGOGASTRODUODENOSCOPY (EGD) WITH PROPOFOL;  Surgeon: Toledo, Boykin Nearingeodoro K, MD;  Location: [facility] ENDOSCOPY;  Service: Gastroenterology;  Laterality: N/A;  . ESOPHAGOGASTRODUODENOSCOPY (EGD) WITH PROPOFOL N/A 09/22/2018   Procedure: ESOPHAGOGASTRODUODENOSCOPY (EGD) WITH PROPOFOL;  Surgeon: Toledo, Boykin Nearingeodoro K, MD;  Location: [facility] ENDOSCOPY;  Service: Gastroenterology;  Laterality: N/A;  . ESOPHAGOGASTRODUODENOSCOPY (EGD) WITH PROPOFOL N/A 10/20/2018   Procedure: ESOPHAGOGASTRODUODENOSCOPY (EGD) WITH PROPOFOL;  Surgeon: Toledo, Boykin Nearingeodoro K, MD;  Location: [facility] ENDOSCOPY;  Service: Gastroenterology;  Laterality: N/A;  . ESOPHAGOGASTRODUODENOSCOPY (EGD) WITH PROPOFOL N/A 02/23/2019   Procedure: ESOPHAGOGASTRODUODENOSCOPY (EGD) WITH PROPOFOL;  Surgeon: Toledo, Boykin Nearingeodoro K, MD;  Location: [facility] ENDOSCOPY;  Service: Gastroenterology;  Laterality: N/A;  . ESOPHAGOGASTRODUODENOSCOPY (EGD) WITH PROPOFOL N/A 02/08/2020   Procedure: ESOPHAGOGASTRODUODENOSCOPY (EGD) WITH PROPOFOL;  Surgeon: Toledo, Boykin Nearing, MD;  Location: [facility] ENDOSCOPY;  Service: Gastroenterology;  Laterality: N/A;  . EYE SURGERY    . JOINT REPLACEMENT Left    Hip  . JOINT REPLACEMENT Right    Knee  . LIVER BIOPSY    . ROTATOR CUFF REPAIR      Prior to Admission medications   Medication Sig Start Date End Date Taking? Authorizing Provider  aspirin 81 MG tablet Take 81 mg by mouth daily.    [provider]  atorvastatin (LIPITOR) 40 MG tablet Take 40 mg by mouth daily.    [provider]  Bromfenac Sodium (PROLENSA) 0.07 % SOLN Apply 1 drop to eye daily.    [provider]  cycloSPORINE (RESTASIS) 0.05 % ophthalmic emulsion Place 1 drop into both eyes 2 (two) times daily.    [provider]  docusate sodium (COLACE) 100 MG capsule Take 100 mg by mouth 2 (two) times daily.    [provider]  empagliflozin (JARDIANCE) 25 MG TABS tablet Take 25 mg by mouth daily.    [provider]  ergocalciferol (VITAMIN D2) 50000 UNITS capsule Take 50,000 Units by mouth once a week.    [provider]  ezetimibe (ZETIA) 10 MG tablet Take 10 mg by mouth daily.    [provider]  febuxostat (ULORIC) 40 MG tablet Take 40 mg by mouth daily.    [provider]  Ferrous Fumarate (HEMOCYTE - 106 MG FE) 324 (106 Fe) MG TABS tablet Take 1 tablet by mouth daily.    [provider]  furosemide (LASIX) 80 MG tablet Take 80 mg by mouth daily.    [provider]  insulin glargine (LANTUS) 100 UNIT/ML injection Inject 60 Units into the skin at bedtime.    [provider]  lactulose (CHRONULAC) 10 GM/15ML solution Take 30 g by mouth 2 (two) times daily as needed for mild constipation.    [provider]  lisinopril-hydrochlorothiazide (PRINZIDE,ZESTORETIC) 20-25 MG tablet Take 1 tablet by mouth daily.    [provider]  Multiple Vitamin (MULTIVITAMIN) tablet Take 1 tablet by mouth daily.    [provider]  nadolol (CORGARD) 40 MG tablet Take 40 mg by mouth daily.    [provider]  nitroGLYCERIN (NITROSTAT) 0.4 MG SL tablet Place 0.4 mg under the tongue every 5 (five) minutes as needed for chest pain.    [provider]  omeprazole (PRILOSEC) 40 MG capsule Take 40 mg by mouth daily.    [provider]  ondansetron (ZOFRAN ODT) 8 MG disintegrating tablet Take 1  tablet (8 mg total) by mouth 2 (two) times daily. 11/14/14   Payton Mccallum, MD  rifaximin (XIFAXAN) 550 MG TABS tablet Take 550 mg by mouth 2 (two) times daily.    [provider]  sitaGLIPtin-metformin (JANUMET) 50-1000 MG tablet Take 1 tablet by mouth 2 (two) times daily with a meal.    [provider]  spironolactone (ALDACTONE) 25 MG tablet Take 25 mg by mouth daily.    [provider]    Allergies Morphine and related  Family History  Problem Relation Age of Onset  . Cancer Father   . Heart attack Brother     Social History Social History   Tobacco Use  . Smoking status: Never Smoker  . Smokeless tobacco: Never Used  Vaping Use  . Vaping Use: Never used  Substance Use Topics  . Alcohol use: No  . Drug use: Not  Currently    Review of Systems  Constitutional: No fever/chills.  Positive for generalized weakness. Eyes: No visual changes. ENT: No sore throat. Cardiovascular: Denies chest pain. Respiratory: Denies shortness of breath. Gastrointestinal: No abdominal pain.  Positive for hematemesis and bloody stool. No constipation. Genitourinary: Negative for dysuria. Musculoskeletal: Negative for back pain. Skin: Negative for rash. Neurological: Negative for headaches, focal weakness or numbness.  ____________________________________________   PHYSICAL EXAM:  VITAL SIGNS: ED Triage Vitals  Enc Vitals Group     BP 03/14/20 1330 98/80     Pulse Rate 03/14/20 1330 78     Resp 03/14/20 1330 17     Temp 03/14/20 1330 97.7 F (36.5 C)     Temp Source 03/14/20 1330 Oral     SpO2 03/14/20 1330 100 %     Weight 03/14/20 1331 212 lb (96.2 kg)     Height 03/14/20 1331 5\' 9"  (1.753 m)     Head Circumference --      Peak Flow --      Pain Score 03/14/20 1331 0     Pain Loc --      Pain Edu? --      Excl. in GC? --     Constitutional: Alert and oriented.  Pale appearing. Eyes: Conjunctivae are normal. Head: Atraumatic. Nose: No  congestion/rhinnorhea. Mouth/Throat: Mucous membranes are moist. Neck: Normal ROM Cardiovascular: Normal rate, regular rhythm. Grossly normal heart sounds. Respiratory: Normal respiratory effort.  No retractions. Lungs CTAB. Gastrointestinal: Soft and nontender. No distention. Genitourinary: deferred Musculoskeletal: No lower extremity tenderness nor edema. Neurologic:  Normal speech and language. No gross focal neurologic deficits are appreciated. Skin:  Skin is warm, dry and intact. No rash noted. Psychiatric: Mood and affect are normal. Speech and behavior are normal.  ____________________________________________   LABS (all labs ordered are listed, but only abnormal results are displayed)  Labs Reviewed  COMPREHENSIVE METABOLIC PANEL - Abnormal; Notable for the following components:      Result Value   Glucose, Bld 150 (*)    BUN 37 (*)    Creatinine, Ser 1.88 (*)    Total Bilirubin 1.6 (*)    GFR, Estimated 38 (*)    All other components within normal limits  CBC - Abnormal; Notable for the following components:   WBC 11.1 (*)    RBC 4.16 (*)    MCH 34.1 (*)    MCHC 36.2 (*)    Platelets 106 (*)    All other components within normal limits  PROTIME-INR - Abnormal; Notable for the following components:   Prothrombin Time 15.5 (*)    INR 1.3 (*)    All other components within normal limits  RESP PANEL BY RT-PCR (FLU A&B, COVID) ARPGX2  POC OCCULT BLOOD, ED  TYPE AND SCREEN    PROCEDURES  Procedure(s) performed (including Critical Care):  .Critical Care Performed by: 05/14/20, MD Authorized by: Chesley Noon, MD   Critical care provider statement:    Critical care time (minutes):  45   Critical care time was exclusive of:  Separately billable procedures and treating other patients and teaching time   Critical care was necessary to treat or prevent imminent or life-threatening deterioration of the following conditions:  Circulatory failure   Critical  care was time spent personally by me on the following activities:  Discussions with consultants, evaluation of patient's response to treatment, examination of patient, ordering and performing treatments and interventions, ordering and review of laboratory studies, ordering and review  of radiographic studies, pulse oximetry, re-evaluation of patient's condition, obtaining history from patient or surrogate and review of old charts   I assumed direction of critical care for this patient from another provider in my specialty: no     Care discussed with: admitting provider      ED ECG REPORT I, Chesley Noon, the attending physician, personally viewed and interpreted this ECG.   Date: 03/14/2020  EKG Time: 14:50  Rate: 69  Rhythm: normal sinus rhythm  Axis: LAD  Intervals:nonspecific intraventricular conduction delay  ST&T Change: None  ____________________________________________   INITIAL IMPRESSION / ASSESSMENT AND PLAN / ED COURSE       70 year old male with past medical history of hypertension, hyperlipidemia, diabetes, GERD, CKD, and cirrhosis who presents to the ED for 1 episode of hematemesis and multiple bloody bowel movements starting this morning.  Patient arrives hemodynamically stable but given his history of varices, presentation is concerning for variceal bleed.  We will initiate treatment with octreotide bolus and drip, IV Protonix, and Rocephin.  Case discussed with Dr. [name] of GI, who will evaluate the patient and attempt endoscopy later this afternoon.  Hemoglobin is stable at this time at 14 but patient remains high risk for upper GI bleed.  Plan to discuss with hospitalist for admission.      ____________________________________________   FINAL CLINICAL IMPRESSION(S) / ED DIAGNOSES  Final diagnoses:  Upper GI bleed     ED Discharge Orders    None       Note:  This document was prepared using Dragon voice recognition software and may include  unintentional dictation errors.   Chesley Noon, MD 03/14/20 1437    Chesley Noon, MD 03/14/20 1501

## 2020-03-14 NOTE — Anesthesia Procedure Notes (Signed)
Procedure Name: Intubation Date/Time: 03/14/2020 4:26 PM Performed by: Ginger Carne, CRNA Pre-anesthesia Checklist: Patient identified, Emergency Drugs available, Suction available, Patient being monitored and Timeout performed Patient Re-evaluated:Patient Re-evaluated prior to induction Oxygen Delivery Method: Circle system utilized Preoxygenation: Pre-oxygenation with 100% oxygen Induction Type: IV induction and Rapid sequence Laryngoscope Size: McGraph and 3 Grade View: Grade I Tube type: Oral Tube size: 7.0 mm Airway Equipment and Method: Patient positioned with wedge pillow,  Stylet and Video-laryngoscopy Placement Confirmation: ETT inserted through vocal cords under direct vision,  positive ETCO2 and breath sounds checked- equal and bilateral Secured at: 21 cm Tube secured with: Tape Dental Injury: Teeth and Oropharynx as per pre-operative assessment  Difficulty Due To: Difficulty was anticipated

## 2020-03-14 NOTE — ED Triage Notes (Signed)
Pt comes into the ED via POV c/o rectal bleeding and hematemesis.  Pt had an iron infusion 2 days ago, felt good yesterday, and then woke up this morning with vomiting and diarrhea that started off with dark blood and nor are progressing to bright red blood.  Pt admits to h/o cirrhosis, bleeding ulcers, and kidney problems. Pt does admit to feeling weaker than normal, but has good pallor at this time.  Pt in NAD with even and unlabored respirations.

## 2020-03-15 ENCOUNTER — Encounter: Payer: Self-pay | Admitting: Internal Medicine

## 2020-03-15 ENCOUNTER — Inpatient Hospital Stay
Admission: EM | Admit: 2020-03-15 | Discharge: 2020-03-16 | DRG: 432 | Payer: Commercial Managed Care - PPO | Attending: Pulmonary Disease | Admitting: Pulmonary Disease

## 2020-03-15 ENCOUNTER — Inpatient Hospital Stay: Payer: Commercial Managed Care - PPO

## 2020-03-15 ENCOUNTER — Emergency Department: Payer: Commercial Managed Care - PPO

## 2020-03-15 ENCOUNTER — Encounter: Payer: Self-pay | Admitting: Pulmonary Disease

## 2020-03-15 DIAGNOSIS — Z7982 Long term (current) use of aspirin: Secondary | ICD-10-CM

## 2020-03-15 DIAGNOSIS — Z885 Allergy status to narcotic agent status: Secondary | ICD-10-CM

## 2020-03-15 DIAGNOSIS — D62 Acute posthemorrhagic anemia: Secondary | ICD-10-CM | POA: Diagnosis present

## 2020-03-15 DIAGNOSIS — K7469 Other cirrhosis of liver: Secondary | ICD-10-CM | POA: Diagnosis present

## 2020-03-15 DIAGNOSIS — N179 Acute kidney failure, unspecified: Secondary | ICD-10-CM | POA: Diagnosis present

## 2020-03-15 DIAGNOSIS — K922 Gastrointestinal hemorrhage, unspecified: Secondary | ICD-10-CM

## 2020-03-15 DIAGNOSIS — Z9049 Acquired absence of other specified parts of digestive tract: Secondary | ICD-10-CM

## 2020-03-15 DIAGNOSIS — D509 Iron deficiency anemia, unspecified: Secondary | ICD-10-CM | POA: Diagnosis present

## 2020-03-15 DIAGNOSIS — K921 Melena: Secondary | ICD-10-CM | POA: Diagnosis present

## 2020-03-15 DIAGNOSIS — R161 Splenomegaly, not elsewhere classified: Secondary | ICD-10-CM | POA: Diagnosis present

## 2020-03-15 DIAGNOSIS — K219 Gastro-esophageal reflux disease without esophagitis: Secondary | ICD-10-CM | POA: Diagnosis present

## 2020-03-15 DIAGNOSIS — Z8249 Family history of ischemic heart disease and other diseases of the circulatory system: Secondary | ICD-10-CM

## 2020-03-15 DIAGNOSIS — Z452 Encounter for adjustment and management of vascular access device: Secondary | ICD-10-CM

## 2020-03-15 DIAGNOSIS — R188 Other ascites: Secondary | ICD-10-CM | POA: Diagnosis present

## 2020-03-15 DIAGNOSIS — K7460 Unspecified cirrhosis of liver: Secondary | ICD-10-CM

## 2020-03-15 DIAGNOSIS — Z978 Presence of other specified devices: Secondary | ICD-10-CM

## 2020-03-15 DIAGNOSIS — J9601 Acute respiratory failure with hypoxia: Secondary | ICD-10-CM | POA: Diagnosis not present

## 2020-03-15 DIAGNOSIS — D696 Thrombocytopenia, unspecified: Secondary | ICD-10-CM | POA: Diagnosis present

## 2020-03-15 DIAGNOSIS — I8501 Esophageal varices with bleeding: Secondary | ICD-10-CM | POA: Diagnosis present

## 2020-03-15 DIAGNOSIS — I129 Hypertensive chronic kidney disease with stage 1 through stage 4 chronic kidney disease, or unspecified chronic kidney disease: Secondary | ICD-10-CM | POA: Diagnosis present

## 2020-03-15 DIAGNOSIS — R578 Other shock: Secondary | ICD-10-CM | POA: Diagnosis present

## 2020-03-15 DIAGNOSIS — I868 Varicose veins of other specified sites: Secondary | ICD-10-CM | POA: Diagnosis present

## 2020-03-15 DIAGNOSIS — I81 Portal vein thrombosis: Secondary | ICD-10-CM | POA: Diagnosis present

## 2020-03-15 DIAGNOSIS — N183 Chronic kidney disease, stage 3 unspecified: Secondary | ICD-10-CM | POA: Diagnosis present

## 2020-03-15 DIAGNOSIS — Z20822 Contact with and (suspected) exposure to covid-19: Secondary | ICD-10-CM | POA: Diagnosis present

## 2020-03-15 DIAGNOSIS — Z961 Presence of intraocular lens: Secondary | ICD-10-CM | POA: Diagnosis present

## 2020-03-15 DIAGNOSIS — E1122 Type 2 diabetes mellitus with diabetic chronic kidney disease: Secondary | ICD-10-CM | POA: Diagnosis present

## 2020-03-15 DIAGNOSIS — I8500 Esophageal varices without bleeding: Secondary | ICD-10-CM | POA: Diagnosis not present

## 2020-03-15 DIAGNOSIS — Z9841 Cataract extraction status, right eye: Secondary | ICD-10-CM | POA: Diagnosis not present

## 2020-03-15 DIAGNOSIS — N1832 Chronic kidney disease, stage 3b: Secondary | ICD-10-CM

## 2020-03-15 DIAGNOSIS — E7800 Pure hypercholesterolemia, unspecified: Secondary | ICD-10-CM | POA: Diagnosis present

## 2020-03-15 DIAGNOSIS — Z9842 Cataract extraction status, left eye: Secondary | ICD-10-CM

## 2020-03-15 DIAGNOSIS — Z794 Long term (current) use of insulin: Secondary | ICD-10-CM

## 2020-03-15 DIAGNOSIS — G4733 Obstructive sleep apnea (adult) (pediatric): Secondary | ICD-10-CM | POA: Diagnosis present

## 2020-03-15 DIAGNOSIS — K766 Portal hypertension: Secondary | ICD-10-CM | POA: Diagnosis present

## 2020-03-15 DIAGNOSIS — Z809 Family history of malignant neoplasm, unspecified: Secondary | ICD-10-CM

## 2020-03-15 DIAGNOSIS — Z79899 Other long term (current) drug therapy: Secondary | ICD-10-CM

## 2020-03-15 LAB — COMPREHENSIVE METABOLIC PANEL
ALT: 24 U/L (ref 0–44)
AST: 30 U/L (ref 15–41)
Albumin: 2.2 g/dL — ABNORMAL LOW (ref 3.5–5.0)
Alkaline Phosphatase: 42 U/L (ref 38–126)
Anion gap: 6 (ref 5–15)
BUN: 40 mg/dL — ABNORMAL HIGH (ref 8–23)
CO2: 20 mmol/L — ABNORMAL LOW (ref 22–32)
Calcium: 7.2 mg/dL — ABNORMAL LOW (ref 8.9–10.3)
Chloride: 113 mmol/L — ABNORMAL HIGH (ref 98–111)
Creatinine, Ser: 1.93 mg/dL — ABNORMAL HIGH (ref 0.61–1.24)
GFR, Estimated: 37 mL/min — ABNORMAL LOW (ref >60)
Glucose, Bld: 204 mg/dL — ABNORMAL HIGH (ref 70–99)
Potassium: 4.1 mmol/L (ref 3.5–5.1)
Sodium: 139 mmol/L (ref 135–145)
Total Bilirubin: 1 mg/dL (ref 0.3–1.2)
Total Protein: 3.6 g/dL — ABNORMAL LOW (ref 6.5–8.1)

## 2020-03-15 LAB — CBC WITH DIFFERENTIAL/PLATELET
Abs Immature Granulocytes: 0.31 10*3/uL — ABNORMAL HIGH (ref 0.00–0.07)
Basophils Absolute: 0.1 10*3/uL (ref 0.0–0.1)
Basophils Relative: 1 %
Eosinophils Absolute: 0.1 10*3/uL (ref 0.0–0.5)
Eosinophils Relative: 1 %
HCT: 20.3 % — ABNORMAL LOW (ref 39.0–52.0)
Hemoglobin: 6.8 g/dL — ABNORMAL LOW (ref 13.0–17.0)
Immature Granulocytes: 2 %
Lymphocytes Relative: 12 %
Lymphs Abs: 1.6 10*3/uL (ref 0.7–4.0)
MCH: 33.3 pg (ref 26.0–34.0)
MCHC: 33.5 g/dL (ref 30.0–36.0)
MCV: 99.5 fL (ref 80.0–100.0)
Monocytes Absolute: 0.9 10*3/uL (ref 0.1–1.0)
Monocytes Relative: 7 %
Neutro Abs: 10.1 10*3/uL — ABNORMAL HIGH (ref 1.7–7.7)
Neutrophils Relative %: 77 %
Platelets: 131 10*3/uL — ABNORMAL LOW (ref 150–400)
RBC: 2.04 MIL/uL — ABNORMAL LOW (ref 4.22–5.81)
RDW: 13.8 % (ref 11.5–15.5)
WBC: 13 10*3/uL — ABNORMAL HIGH (ref 4.0–10.5)
nRBC: 0.3 % — ABNORMAL HIGH (ref 0.0–0.2)

## 2020-03-15 LAB — CBC
HCT: 30.5 % — ABNORMAL LOW (ref 39.0–52.0)
HCT: 29.7 % — ABNORMAL LOW (ref 39.0–52.0)
Hemoglobin: 10.4 g/dL — ABNORMAL LOW (ref 13.0–17.0)
Hemoglobin: 10.4 g/dL — ABNORMAL LOW (ref 13.0–17.0)
MCH: 33.1 pg (ref 26.0–34.0)
MCH: 34 pg (ref 26.0–34.0)
MCHC: 34.1 g/dL (ref 30.0–36.0)
MCHC: 35 g/dL (ref 30.0–36.0)
MCV: 97.1 fL (ref 80.0–100.0)
MCV: 97.1 fL (ref 80.0–100.0)
Platelets: 55 10*3/uL — ABNORMAL LOW (ref 150–400)
Platelets: 66 10*3/uL — ABNORMAL LOW (ref 150–400)
RBC: 3.14 MIL/uL — ABNORMAL LOW (ref 4.22–5.81)
RBC: 3.06 MIL/uL — ABNORMAL LOW (ref 4.22–5.81)
RDW: 13.8 % (ref 11.5–15.5)
RDW: 13.7 % (ref 11.5–15.5)
WBC: 5.8 10*3/uL (ref 4.0–10.5)
WBC: 7.9 10*3/uL (ref 4.0–10.5)
nRBC: 0 % (ref 0.0–0.2)
nRBC: 0 % (ref 0.0–0.2)

## 2020-03-15 LAB — GLUCOSE, CAPILLARY
Glucose-Capillary: 98 mg/dL (ref 70–99)
Glucose-Capillary: 206 mg/dL — ABNORMAL HIGH (ref 70–99)
Glucose-Capillary: 238 mg/dL — ABNORMAL HIGH (ref 70–99)

## 2020-03-15 LAB — BASIC METABOLIC PANEL
Anion gap: 12 (ref 5–15)
BUN: 38 mg/dL — ABNORMAL HIGH (ref 8–23)
CO2: 18 mmol/L — ABNORMAL LOW (ref 22–32)
Calcium: 7.8 mg/dL — ABNORMAL LOW (ref 8.9–10.3)
Chloride: 109 mmol/L (ref 98–111)
Creatinine, Ser: 1.96 mg/dL — ABNORMAL HIGH (ref 0.61–1.24)
GFR, Estimated: 36 mL/min — ABNORMAL LOW (ref >60)
Glucose, Bld: 238 mg/dL — ABNORMAL HIGH (ref 70–99)
Potassium: 4.4 mmol/L (ref 3.5–5.1)
Sodium: 139 mmol/L (ref 135–145)

## 2020-03-15 LAB — PROTIME-INR
INR: 1.8 — ABNORMAL HIGH (ref 0.8–1.2)
Prothrombin Time: 20.2 seconds — ABNORMAL HIGH (ref 11.4–15.2)

## 2020-03-15 LAB — TROPONIN I (HIGH SENSITIVITY)
Troponin I (High Sensitivity): 13 ng/L (ref <18)
Troponin I (High Sensitivity): 8 ng/L (ref <18)

## 2020-03-15 LAB — TYPE AND SCREEN
ABO/RH(D): O NEG
Antibody Screen: NEGATIVE

## 2020-03-15 LAB — LACTIC ACID, PLASMA: Lactic Acid, Venous: 3.6 mmol/L (ref 0.5–1.9)

## 2020-03-15 MED ORDER — NOREPINEPHRINE 4 MG/250ML-% IV SOLN: 2.0000 ug/min | INTRAVENOUS | Status: DC

## 2020-03-15 MED ORDER — OCTREOTIDE LOAD VIA INFUSION
50.0000 ug | Freq: Once | INTRAVENOUS | Status: AC
  Administered 2020-03-15: 50 ug via INTRAVENOUS
  Filled 2020-03-15: qty 25

## 2020-03-15 MED ORDER — SODIUM CHLORIDE 0.9 % IV SOLN
1.0000 g | Freq: Once | INTRAVENOUS | Status: AC
  Administered 2020-03-16: 1 g via INTRAVENOUS
  Filled 2020-03-15: qty 1

## 2020-03-15 MED ORDER — IOHEXOL 350 MG/ML SOLN: 75.0000 mL | Freq: Once | INTRAVENOUS | Status: DC | PRN

## 2020-03-15 MED ORDER — NOREPINEPHRINE 4 MG/250ML-% IV SOLN
INTRAVENOUS | Status: AC
  Administered 2020-03-15: 15 ug/min via INTRAVENOUS
  Filled 2020-03-15: qty 250

## 2020-03-15 MED ORDER — SODIUM CHLORIDE 0.9 % IV SOLN: 250.0000 mL | INTRAVENOUS | Status: DC

## 2020-03-15 MED ORDER — SODIUM CHLORIDE 0.9 % IV SOLN
50.0000 ug/h | INTRAVENOUS | Status: DC
  Administered 2020-03-15: 50 ug/h via INTRAVENOUS
  Filled 2020-03-15 (×6): qty 1

## 2020-03-15 MED ORDER — VASOPRESSIN 20 UNIT/ML IV SOLN
0.0000 [IU]/min | INTRAVENOUS | Status: DC
  Administered 2020-03-16: 0.5 [IU]/min via INTRAVENOUS
  Administered 2020-03-16: 0.7 [IU]/min via INTRAVENOUS
  Administered 2020-03-16: 0.2 [IU]/min via INTRAVENOUS
  Administered 2020-03-16: 0.5 [IU]/min via INTRAVENOUS
  Administered 2020-03-16: 0.8 [IU]/min via INTRAVENOUS
  Filled 2020-03-15 (×2): qty 2.5
  Filled 2020-03-15: qty 1.5
  Filled 2020-03-15 (×8): qty 2.5

## 2020-03-15 MED ORDER — SODIUM CHLORIDE 0.9 % IV SOLN
8.0000 mg/h | INTRAVENOUS | Status: DC
  Administered 2020-03-16 (×2): 8 mg/h via INTRAVENOUS
  Filled 2020-03-15 (×4): qty 80

## 2020-03-15 MED ORDER — ONDANSETRON HCL 4 MG/2ML IJ SOLN
4.0000 mg | Freq: Four times a day (QID) | INTRAMUSCULAR | Status: DC | PRN
  Administered 2020-03-16 (×2): 4 mg via INTRAVENOUS
  Filled 2020-03-15 (×2): qty 2

## 2020-03-15 MED ORDER — SODIUM CHLORIDE 0.9 % IV BOLUS
1000.0000 mL | Freq: Once | INTRAVENOUS | Status: AC
  Administered 2020-03-15: 1000 mL via INTRAVENOUS

## 2020-03-15 MED ORDER — SODIUM CHLORIDE 0.9% IV SOLUTION
Freq: Once | INTRAVENOUS | Status: DC
  Filled 2020-03-15: qty 250

## 2020-03-15 MED ORDER — SODIUM CHLORIDE 0.9 % IV SOLN
80.0000 mg | Freq: Once | INTRAVENOUS | Status: AC
  Administered 2020-03-15: 22:00:00 80 mg via INTRAVENOUS
  Filled 2020-03-15: qty 80

## 2020-03-15 NOTE — ED Provider Notes (Incomplete)
[facility] Emergency Department Provider Note ____________________________________________   Event Date/Time   First MD Initiated Contact with Patient 03/15/20 2209     (approximate)  I have reviewed the triage vital signs and the nursing notes.   HISTORY  Chief Complaint GI Bleeding    HPI Dillon Graves is a 70 y.o. male with PMH as noted below including liver cirrhosis and esophageal varices presents with GI bleeding, persistent course this afternoon, and associated with weakness and low blood pressure.  The patient states that he was discharged from the hospital earlier today.  He has had 3 bowel movements with bright red blood.  He then started feeling weak and lightheaded.  He states he did not have any vomiting until he was in the ambulance and started to feel nauseous.  He had one episode of blood-tinged emesis.  He states that his nausea is now better.  He denies any abdominal pain, chest pain, or shortness of breath.  Past Medical History:  Diagnosis Date  . Anemia    IDA  . Anesthesia complication   . Arthritis   . Carpal tunnel syndrome   . Carpal tunnel syndrome    RIGHT HAND  . Carpal tunnel syndrome   . Chronic kidney disease    STAGE 3  . Cirrhosis of liver without ascites (HCC)   . Complication of anesthesia    SLOW TO WAKE UP  . Constipation   . Constipation   . Diabetes mellitus without complication (HCC)   . Dyspnea   . Edema   . GERD (gastroesophageal reflux disease)   . Gout   . Gout   . Hypercholesteremia   . Hypertension   . Incoordination   . Ischial bursitis   . Muscle weakness   . Osteoarthritis   . PONV (postoperative nausea and vomiting)   . Portal venous hypertension (HCC)   . Post-operative nausea and vomiting   . Sleep apnea   . SOB (shortness of breath)   . Splenomegaly   . Splenomegaly   . Thrombocytopenia (HCC)   . Vertigo     Patient Active Problem List   Diagnosis Date Noted  . GI bleeding  03/14/2020  . Iron deficiency anemia 03/14/2020  . GERD (gastroesophageal reflux disease)   . Cirrhosis of liver without ascites (HCC)   . Gout   . Thrombocytopenia (HCC)   . Type II diabetes mellitus with renal manifestations (HCC)   . CKD (chronic kidney disease), stage IIIb   . HLD (hyperlipidemia)     Past Surgical History:  Procedure Laterality Date  . CARPAL TUNNEL RELEASE    . CATARACT EXTRACTION W/ INTRAOCULAR LENS IMPLANT Bilateral   . CHOLECYSTECTOMY  07/15/2017  . COLONOSCOPY    . COLONOSCOPY WITH PROPOFOL N/A 11/16/2019   Procedure: COLONOSCOPY WITH PROPOFOL;  Surgeon: Toledo, Boykin Nearing, MD;  Location: [facility] ENDOSCOPY;  Service: Gastroenterology;  Laterality: N/A;  . ESOPHAGOGASTRODUODENOSCOPY (EGD) WITH PROPOFOL N/A 06/09/2018   Procedure: ESOPHAGOGASTRODUODENOSCOPY (EGD) WITH PROPOFOL;  Surgeon: Toledo, Boykin Nearing, MD;  Location: [facility] ENDOSCOPY;  Service: Gastroenterology;  Laterality: N/A;  . ESOPHAGOGASTRODUODENOSCOPY (EGD) WITH PROPOFOL N/A 09/22/2018   Procedure: ESOPHAGOGASTRODUODENOSCOPY (EGD) WITH PROPOFOL;  Surgeon: Toledo, Boykin Nearing, MD;  Location: [facility] ENDOSCOPY;  Service: Gastroenterology;  Laterality: N/A;  . ESOPHAGOGASTRODUODENOSCOPY (EGD) WITH PROPOFOL N/A 10/20/2018   Procedure: ESOPHAGOGASTRODUODENOSCOPY (EGD) WITH PROPOFOL;  Surgeon: Toledo, Boykin Nearing, MD;  Location: [facility]OSCOPY;  Service: Gastroenterology;  Laterality: N/A;  . ESOPHAGOGASTRODUODENOSCOPY (EGD) WITH PROPOFOL  N/A 02/23/2019   Procedure: ESOPHAGOGASTRODUODENOSCOPY (EGD) WITH PROPOFOL;  Surgeon: Toledo, Boykin Nearing, MD;  Location: [facility];  Service: Gastroenterology;  Laterality: N/A;  . ESOPHAGOGASTRODUODENOSCOPY (EGD) WITH PROPOFOL N/A 02/08/2020   Procedure: ESOPHAGOGASTRODUODENOSCOPY (EGD) WITH PROPOFOL;  Surgeon: Toledo, Boykin Nearing, MD;  Location: [facility] ENDOSCOPY;  Service: Gastroenterology;  Laterality: N/A;  . EYE SURGERY    . JOINT REPLACEMENT Left    Hip  . JOINT REPLACEMENT Right     Knee  . LIVER BIOPSY    . ROTATOR CUFF REPAIR      Prior to Admission medications   Medication Sig Start Date End Date Taking? Authorizing Provider  Ascorbic Acid (VITAMIN C) 1000 MG tablet Take 1,000 mg by mouth daily.    [provider]  aspirin 81 MG tablet Take 81 mg by mouth daily.    [provider]  atorvastatin (LIPITOR) 80 MG tablet Take 80 mg by mouth daily.    [provider]  Bromfenac Sodium (PROLENSA) 0.07 % SOLN Place 1 drop into both eyes 2 (two) times daily.    [provider]  cycloSPORINE (RESTASIS) 0.05 % ophthalmic emulsion Place 1 drop into both eyes 2 (two) times daily.    [provider]  empagliflozin (JARDIANCE) 25 MG TABS tablet Take 25 mg by mouth daily.    [provider]  ergocalciferol (VITAMIN D2) 50000 UNITS capsule Take 50,000 Units by mouth every Saturday.    [provider]  ezetimibe (ZETIA) 10 MG tablet Take 10 mg by mouth daily.    [provider]  Febuxostat 80 MG TABS Take 80 mg by mouth daily.    [provider]  furosemide (LASIX) 80 MG tablet Take 40-80 mg by mouth See admin instructions. Take 1 tablet (80mg ) by mouth every morning and take  tablet (40mg ) by mouth every afternoon    [provider]  hydrOXYzine (ATARAX/VISTARIL) 50 MG tablet Take 50 mg by mouth at bedtime. 02/22/20   [provider]  Insulin Glargine (BASAGLAR KWIKPEN) 100 UNIT/ML Inject 25-50 Units into the skin See admin instructions. Inject 50u under the skin every morning and inject 25u to 50u under the skin every night based upon blood glucose reading 01/20/20   [provider]  Iron-Vitamin C 65-125 MG TABS Take 1 tablet by mouth as directed.    [provider]  lactulose (CHRONULAC) 10 GM/15ML solution Take 30 g by mouth 2 (two) times daily.    [provider]  Milk Thistle 500 MG CAPS Take 1 capsule by mouth as directed.    [provider]   Multiple Vitamin (MULTIVITAMIN) tablet Take 1 tablet by mouth daily.    [provider]  nadolol (CORGARD) 40 MG tablet Take 40 mg by mouth daily.    [provider]  nitroGLYCERIN (NITROSTAT) 0.4 MG SL tablet Place 0.4 mg under the tongue every 5 (five) minutes as needed for chest pain.    [provider]  omeprazole (PRILOSEC) 40 MG capsule Take 40 mg by mouth 2 (two) times daily.    [provider]  ondansetron (ZOFRAN ODT) 8 MG disintegrating tablet Take 1 tablet (8 mg total) by mouth 2 (two) times daily. 11/14/14   01/22/20, MD  OZEMPIC, 1 MG/DOSE, 4 MG/3ML SOPN Inject 1 mg into the skin every Saturday. 02/06/20   [provider]  rifaximin (XIFAXAN) 550 MG TABS tablet Take 550 mg by mouth 2 (two) times daily.    [provider]  SitaGLIPtin-MetFORMIN HCl (JANUMET XR) [phone] MG TB24 Take 1 tablet by mouth 2 (two) times daily.    [provider]  spironolactone (ALDACTONE) 50 MG tablet Take 25 mg by mouth daily.    [provider]  VASCEPA 1 g capsule Take 1 g by mouth 2 (two) times daily. 02/06/20   [provider]  Zinc 50 MG TABS Take 100 mg by mouth 2 (two) times daily.    [provider]    Allergies Morphine and related  Family History  Problem Relation Age of Onset  . Cancer Father   . Heart attack Brother     Social History Social History   Tobacco Use  . Smoking status: Never Smoker  . Smokeless tobacco: Never Used  Vaping Use  . Vaping Use: Never used  Substance Use Topics  . Alcohol use: No  . Drug use: Never    Review of Systems  Constitutional: Positive for weakness. Eyes: No redness. ENT: No sore throat. Cardiovascular: Denies chest pain. Respiratory: Denies shortness of breath. Gastrointestinal: Positive for resolved vomiting. Genitourinary: Negative for flank pain. Musculoskeletal: Negative for back pain. Skin: Negative for rash. Neurological: Negative for  headache.   ____________________________________________   PHYSICAL EXAM:  VITAL SIGNS: ED Triage Vitals  Enc Vitals Group     BP 03/15/20 2207 (!) 78/45     Pulse Rate 03/15/20 2207 69     Resp 03/15/20 2207 18     Temp 03/15/20 2209 97.7 F (36.5 C)     Temp Source 03/15/20 2209 Oral     SpO2 03/15/20 2207 98 %     Weight 03/15/20 2209 211 lb 10.3 oz (96 kg)     Height 03/15/20 2209 5\' 9"  (1.753 m)     Head Circumference --      Peak Flow --      Pain Score 03/15/20 2208 0     Pain Loc --      Pain Edu? --      Excl. in GC? --     Constitutional: Alert and oriented.  Weak appearing but in no acute distress. Eyes: Conjunctivae are pale. Head: Atraumatic. Nose: No congestion/rhinnorhea. Mouth/Throat: Mucous membranes are moist.   Neck: Normal range of motion.  Cardiovascular: Normal rate, regular rhythm. Grossly normal heart sounds.  Good peripheral circulation. Respiratory: Normal respiratory effort.  No retractions. Lungs CTAB. Gastrointestinal: Soft and nontender. No distention.  Genitourinary: No flank tenderness. Musculoskeletal: Extremities warm and well perfused.  Neurologic:  Normal speech and language. No gross focal neurologic deficits are appreciated.  Skin:  Skin is warm and dry. No rash noted. Psychiatric: Mood and affect are normal. Speech and behavior are normal.  ____________________________________________   LABS (all labs ordered are listed, but only abnormal results are displayed)  Labs Reviewed  RESP PANEL BY RT-PCR (FLU A&B, COVID) [id number]  COMPREHENSIVE METABOLIC PANEL  CBC WITH DIFFERENTIAL/PLATELET  PROTIME-INR  LACTIC ACID, PLASMA  TYPE AND SCREEN  TROPONIN I (HIGH SENSITIVITY)   ____________________________________________  EKG  ED ECG REPORT I, Dionne BucySebastian Siadecki, the attending physician, personally viewed and interpreted this ECG.  Date: 03/15/2020 EKG Time: 2206 Rate: 69 Rhythm: normal sinus rhythm QRS Axis:  normal Intervals: LAFB, prolonged QTc ST/T Wave abnormalities: normal Narrative Interpretation: no evidence of acute ischemia  ____________________________________________  RADIOLOGY  Chest x-ray interpreted by me shows no focal infiltrate or edema  ____________________________________________   PROCEDURES  Procedure(s) performed: No  Procedures  Critical Care performed: Yes  CRITICAL CARE  Performed by: Dionne Bucy   Total critical care time: 60 minutes  Critical care time was exclusive of separately billable procedures and treating other patients.  Critical care was necessary to treat or prevent imminent or life-threatening deterioration.  Critical care was time spent personally by me on the following activities: development of treatment plan with patient and/or surrogate as well as nursing, discussions with consultants, evaluation of patient's response to treatment, examination of patient, obtaining history from patient or surrogate, ordering and performing treatments and interventions, ordering and review of laboratory studies, ordering and review of radiographic studies, pulse oximetry and re-evaluation of patient's condition. ____________________________________________   INITIAL IMPRESSION / ASSESSMENT AND PLAN / ED COURSE  Pertinent labs & imaging results that were available during my care of the patient were reviewed by me and considered in my medical decision making (see chart for details).  70 year old male with PMH as noted above including cirrhosis, esophageal varices, GI bleed presents with several bloody stools as well as generalized weakness and hypotension.  The systolic blood pressure was noted to be as low as the 50s by EMS and he was given 500 cc of fluid and Zofran while in route.  The patient had one episode of vomiting which was somewhat pinkish blood-tinged but otherwise has not had any hematemesis.  I reviewed the past medical records in Epic.   The patient was seen in the ED yesterday with hematemesis and bloody stools and was evaluated by GI.  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  He was discharged earlier today.  On exam, the patient is pale and weak appearing.  He is hypotensive to the 80s over 40s with otherwise normal vital signs.  The abdomen is soft and nontender.  Physical exam is otherwise unremarkable.  Presentation is concerning for recurrent acute GI bleed.  Fluid resuscitation was immediately begun with a liter of normal saline.  I ordered immediate transfusion of 2 units of type O PRBCs as well as octreotide and Protonix infusions.  We will closely monitor the patient, continue resuscitation, and consult GI and the ICU.  ----------------------------------------- 11:08 PM on 03/15/2020 -----------------------------------------  The patient has received 1 unit of PRBCs and the first liter of fluid.  His blood pressure improved somewhat for time but still remains in the 80s/40s.  We will continue fluid resuscitation and blood administration.  I discussed the case with APP Annabelle Harman from the ICU who will come to evaluate the patient  ----------------------------------------- 11:30 PM on 03/15/2020 -----------------------------------------  Discussed with Dr. Tobi Bastos.  Plan for admission to ICU.   ____________________________________________   FINAL CLINICAL IMPRESSION(S) / ED DIAGNOSES  Final diagnoses:  None      NEW MEDICATIONS STARTED DURING THIS VISIT:  New Prescriptions   No medications on file     Note:  This document was prepared using Dragon voice recognition software and may include unintentional dictation errors.

## 2020-03-15 NOTE — ED Provider Notes (Signed)
[facility] Emergency Department Provider Note ____________________________________________   Event Date/Time   First MD Initiated Contact with Patient 03/15/20 2209     (approximate)  I have reviewed the triage vital signs and the nursing notes.   HISTORY  Chief Complaint GI Bleeding    HPI Dillon Graves is a 70 y.o. male with PMH as noted below including liver cirrhosis and esophageal varices presents with GI bleeding, persistent course this afternoon, and associated with weakness and low blood pressure.  The patient states that he was discharged from the hospital earlier today.  He has had 3 bowel movements with bright red blood.  He then started feeling weak and lightheaded.  He states he did not have any vomiting until he was in the ambulance and started to feel nauseous.  He had one episode of blood-tinged emesis.  He states that his nausea is now better.  He denies any abdominal pain, chest pain, or shortness of breath.  Past Medical History:  Diagnosis Date  . Anemia    IDA  . Anesthesia complication   . Arthritis   . Carpal tunnel syndrome   . Carpal tunnel syndrome    RIGHT HAND  . Carpal tunnel syndrome   . Chronic kidney disease    STAGE 3  . Cirrhosis of liver without ascites (HCC)   . Complication of anesthesia    SLOW TO WAKE UP  . Constipation   . Constipation   . Diabetes mellitus without complication (HCC)   . Dyspnea   . Edema   . GERD (gastroesophageal reflux disease)   . Gout   . Gout   . Hypercholesteremia   . Hypertension   . Incoordination   . Ischial bursitis   . Muscle weakness   . Osteoarthritis   . PONV (postoperative nausea and vomiting)   . Portal venous hypertension (HCC)   . Post-operative nausea and vomiting   . Sleep apnea   . SOB (shortness of breath)   . Splenomegaly   . Splenomegaly   . Thrombocytopenia (HCC)   . Vertigo     Patient Active Problem List   Diagnosis Date Noted  . Acute GI  bleeding 03/15/2020  . GI bleeding 03/14/2020  . Iron deficiency anemia 03/14/2020  . GERD (gastroesophageal reflux disease)   . Cirrhosis of liver without ascites (HCC)   . Gout   . Thrombocytopenia (HCC)   . Type II diabetes mellitus with renal manifestations (HCC)   . CKD (chronic kidney disease), stage IIIb   . HLD (hyperlipidemia)     Past Surgical History:  Procedure Laterality Date  . CARPAL TUNNEL RELEASE    . CATARACT EXTRACTION W/ INTRAOCULAR LENS IMPLANT Bilateral   . CHOLECYSTECTOMY  07/15/2017  . COLONOSCOPY    . COLONOSCOPY WITH PROPOFOL N/A 11/16/2019   Procedure: COLONOSCOPY WITH PROPOFOL;  Surgeon: Toledo, Boykin Nearing, MD;  Location: [facility] ENDOSCOPY;  Service: Gastroenterology;  Laterality: N/A;  . ESOPHAGOGASTRODUODENOSCOPY (EGD) WITH PROPOFOL N/A 06/09/2018   Procedure: ESOPHAGOGASTRODUODENOSCOPY (EGD) WITH PROPOFOL;  Surgeon: Toledo, Boykin Nearing, MD;  Location: [facility] ENDOSCOPY;  Service: Gastroenterology;  Laterality: N/A;  . ESOPHAGOGASTRODUODENOSCOPY (EGD) WITH PROPOFOL N/A 09/22/2018   Procedure: ESOPHAGOGASTRODUODENOSCOPY (EGD) WITH PROPOFOL;  Surgeon: Toledo, Boykin Nearing, MD;  Location: [facility] ENDOSCOPY;  Service: Gastroenterology;  Laterality: N/A;  . ESOPHAGOGASTRODUODENOSCOPY (EGD) WITH PROPOFOL N/A 10/20/2018   Procedure: ESOPHAGOGASTRODUODENOSCOPY (EGD) WITH PROPOFOL;  Surgeon: Toledo, Boykin Nearing, MD;  Location: [facility] ENDOSCOPY;  Service: Gastroenterology;  Laterality: N/A;  .  ESOPHAGOGASTRODUODENOSCOPY (EGD) WITH PROPOFOL N/A 02/23/2019   Procedure: ESOPHAGOGASTRODUODENOSCOPY (EGD) WITH PROPOFOL;  Surgeon: Toledo, Boykin Nearingeodoro K, MD;  Location: [facility] ENDOSCOPY;  Service: Gastroenterology;  Laterality: N/A;  . ESOPHAGOGASTRODUODENOSCOPY (EGD) WITH PROPOFOL N/A 02/08/2020   Procedure: ESOPHAGOGASTRODUODENOSCOPY (EGD) WITH PROPOFOL;  Surgeon: Toledo, Boykin Nearingeodoro K, MD;  Location: [facility] ENDOSCOPY;  Service: Gastroenterology;  Laterality: N/A;  . EYE SURGERY    . JOINT REPLACEMENT Left     Hip  . JOINT REPLACEMENT Right    Knee  . LIVER BIOPSY    . ROTATOR CUFF REPAIR      Prior to Admission medications   Medication Sig Start Date End Date Taking? Authorizing Provider  Ascorbic Acid (VITAMIN C) 1000 MG tablet Take 1,000 mg by mouth daily.   Yes [provider]  aspirin 81 MG tablet Take 81 mg by mouth daily.   Yes [provider]  atorvastatin (LIPITOR) 80 MG tablet Take 80 mg by mouth daily.   Yes [provider]  Bromfenac Sodium (PROLENSA) 0.07 % SOLN Place 1 drop into both eyes 2 (two) times daily.   Yes [provider]  cycloSPORINE (RESTASIS) 0.05 % ophthalmic emulsion Place 1 drop into both eyes 2 (two) times daily.   Yes [provider]  empagliflozin (JARDIANCE) 25 MG TABS tablet Take 25 mg by mouth daily.   Yes [provider]  ergocalciferol (VITAMIN D2) 50000 UNITS capsule Take 50,000 Units by mouth every Saturday.   Yes [provider]  ezetimibe (ZETIA) 10 MG tablet Take 10 mg by mouth daily.   Yes [provider]  Febuxostat 80 MG TABS Take 80 mg by mouth daily.   Yes [provider]  furosemide (LASIX) 80 MG tablet Take 40-80 mg by mouth See admin instructions. Take 1 tablet (80mg ) by mouth every morning and take  tablet (40mg ) by mouth every afternoon   Yes [provider]  hydrOXYzine (ATARAX/VISTARIL) 50 MG tablet Take 50 mg by mouth at bedtime. 02/22/20  Yes [provider]  Insulin Glargine (BASAGLAR KWIKPEN) 100 UNIT/ML Inject 25-50 Units into the skin See admin instructions. Inject 50u under the skin every morning and inject 25u to 50u under the skin every night based upon blood glucose reading 01/20/20  Yes [provider]  Iron-Vitamin C 65-125 MG TABS Take 1 tablet by mouth as directed.   Yes [provider]  lactulose (CHRONULAC) 10 GM/15ML solution Take 30 g by mouth 2 (two) times daily.   Yes [provider]  Milk Thistle  500 MG CAPS Take 1 capsule by mouth as directed.   Yes [provider]  Multiple Vitamin (MULTIVITAMIN) tablet Take 1 tablet by mouth daily.   Yes [provider]  nadolol (CORGARD) 40 MG tablet Take 40 mg by mouth daily.   Yes [provider]  omeprazole (PRILOSEC) 40 MG capsule Take 40 mg by mouth 2 (two) times daily.   Yes [provider]  OZEMPIC, 1 MG/DOSE, 4 MG/3ML SOPN Inject 1 mg into the skin every Saturday. 02/06/20  Yes [provider]  rifaximin (XIFAXAN) 550 MG TABS tablet Take 550 mg by mouth 2 (two) times daily.   Yes [provider]  SitaGLIPtin-MetFORMIN HCl (JANUMET XR) [phone] MG TB24 Take 1 tablet by mouth 2 (two) times daily.   Yes [provider]  spironolactone (ALDACTONE) 50 MG tablet Take 25 mg by mouth daily.   Yes [provider]  VASCEPA 1 g capsule Take 1 g by  mouth 2 (two) times daily. 02/06/20  Yes [provider]  Zinc 50 MG TABS Take 100 mg by mouth 2 (two) times daily.   Yes [provider]  nitroGLYCERIN (NITROSTAT) 0.4 MG SL tablet Place 0.4 mg under the tongue every 5 (five) minutes as needed for chest pain.    [provider]  ondansetron (ZOFRAN ODT) 8 MG disintegrating tablet Take 1 tablet (8 mg total) by mouth 2 (two) times daily. Patient not taking: Reported on 03/15/2020 11/14/14   Payton Mccallum, MD    Allergies Morphine and related  Family History  Problem Relation Age of Onset  . Cancer Father   . Heart attack Brother     Social History Social History   Tobacco Use  . Smoking status: Never Smoker  . Smokeless tobacco: Never Used  Vaping Use  . Vaping Use: Never used  Substance Use Topics  . Alcohol use: No  . Drug use: Never    Review of Systems  Constitutional: Positive for weakness. Eyes: No redness. ENT: No sore throat. Cardiovascular: Denies chest pain. Respiratory: Denies shortness of breath. Gastrointestinal: Positive for  resolved vomiting. Genitourinary: Negative for flank pain. Musculoskeletal: Negative for back pain. Skin: Negative for rash. Neurological: Negative for headache.   ____________________________________________   PHYSICAL EXAM:  VITAL SIGNS: ED Triage Vitals  Enc Vitals Group     BP 03/15/20 2207 (!) 78/45     Pulse Rate 03/15/20 2207 69     Resp 03/15/20 2207 18     Temp 03/15/20 2209 97.7 F (36.5 C)     Temp Source 03/15/20 2209 Oral     SpO2 03/15/20 2207 98 %     Weight 03/15/20 2209 211 lb 10.3 oz (96 kg)     Height 03/15/20 2209 5\' 9"  (1.753 m)     Head Circumference --      Peak Flow --      Pain Score 03/15/20 2208 0     Pain Loc --      Pain Edu? --      Excl. in GC? --     Constitutional: Alert and oriented.  Weak appearing but in no acute distress. Eyes: Conjunctivae are pale. Head: Atraumatic. Nose: No congestion/rhinnorhea. Mouth/Throat: Mucous membranes are moist.   Neck: Normal range of motion.  Cardiovascular: Normal rate, regular rhythm. Grossly normal heart sounds.  Good peripheral circulation. Respiratory: Normal respiratory effort.  No retractions. Lungs CTAB. Gastrointestinal: Soft and nontender. No distention.  Genitourinary: No flank tenderness. Musculoskeletal: Extremities warm and well perfused.  Neurologic:  Normal speech and language. No gross focal neurologic deficits are appreciated.  Skin:  Skin is warm and dry. No rash noted. Psychiatric: Mood and affect are normal. Speech and behavior are normal.  ____________________________________________   LABS (all labs ordered are listed, but only abnormal results are displayed)  Labs Reviewed  COMPREHENSIVE METABOLIC PANEL - Abnormal; Notable for the following components:      Result Value   Chloride 113 (*)    CO2 20 (*)    Glucose, Bld 204 (*)    BUN 40 (*)    Creatinine, Ser 1.93 (*)    Calcium 7.2 (*)    Total Protein 3.6 (*)    Albumin 2.2 (*)    GFR, Estimated 37 (*)    All  other components within normal limits  CBC WITH DIFFERENTIAL/PLATELET - Abnormal; Notable for the following components:   WBC 13.0 (*)    RBC 2.04 (*)  Hemoglobin 6.8 (*)    HCT 20.3 (*)    Platelets 131 (*)    nRBC 0.3 (*)    Neutro Abs 10.1 (*)    Abs Immature Granulocytes 0.31 (*)    All other components within normal limits  PROTIME-INR - Abnormal; Notable for the following components:   Prothrombin Time 20.2 (*)    INR 1.8 (*)    All other components within normal limits  LACTIC ACID, PLASMA - Abnormal; Notable for the following components:   Lactic Acid, Venous 3.6 (*)    All other components within normal limits  RESP PANEL BY RT-PCR (FLU A&B, COVID) ARPGX2  HEMOGLOBIN AND HEMATOCRIT, BLOOD  TYPE AND SCREEN  PREPARE RBC (CROSSMATCH)  PREPARE FRESH FROZEN PLASMA  TROPONIN I (HIGH SENSITIVITY)  TROPONIN I (HIGH SENSITIVITY)  TROPONIN I (HIGH SENSITIVITY)   ____________________________________________  EKG  ED ECG REPORT I, Dionne Bucy, the attending physician, personally viewed and interpreted this ECG.  Date: 03/15/2020 EKG Time: 2206 Rate: 69 Rhythm: normal sinus rhythm QRS Axis: normal Intervals: LAFB, prolonged QTc ST/T Wave abnormalities: normal Narrative Interpretation: no evidence of acute ischemia  ____________________________________________  RADIOLOGY  Chest x-ray interpreted by me shows no focal infiltrate or edema  ____________________________________________   PROCEDURES  Procedure(s) performed: No  Procedures  Critical Care performed: Yes  CRITICAL CARE Performed by: Dionne Bucy   Total critical care time: 60 minutes  Critical care time was exclusive of separately billable procedures and treating other patients.  Critical care was necessary to treat or prevent imminent or life-threatening deterioration.  Critical care was time spent personally by me on the following activities: development of treatment plan with  patient and/or surrogate as well as nursing, discussions with consultants, evaluation of patient's response to treatment, examination of patient, obtaining history from patient or surrogate, ordering and performing treatments and interventions, ordering and review of laboratory studies, ordering and review of radiographic studies, pulse oximetry and re-evaluation of patient's condition. ____________________________________________   INITIAL IMPRESSION / ASSESSMENT AND PLAN / ED COURSE  Pertinent labs & imaging results that were available during my care of the patient were reviewed by me and considered in my medical decision making (see chart for details).  70 year old male with PMH as noted above including cirrhosis, esophageal varices, GI bleed presents with several bloody stools as well as generalized weakness and hypotension.  The systolic blood pressure was noted to be as low as the 50s by EMS and he was given 500 cc of fluid and Zofran while in route.  The patient had one episode of vomiting which was somewhat pinkish blood-tinged but otherwise has not had any hematemesis.  I reviewed the past medical records in Epic.  The patient was seen in the ED yesterday with hematemesis and bloody stools and was evaluated by GI.  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  He was discharged earlier today.  On exam, the patient is pale and weak appearing.  He is hypotensive to the 80s over 40s with otherwise normal vital signs.  The abdomen is soft and nontender.  Physical exam is otherwise unremarkable.  Presentation is concerning for recurrent acute GI bleed.  Fluid resuscitation was immediately begun with a liter of normal saline.  I ordered immediate transfusion of 2 units of type O PRBCs as well as octreotide and Protonix infusions.  We will closely monitor the patient, continue  resuscitation, and consult GI and the ICU.  ----------------------------------------- 11:08 PM on 03/15/2020 -----------------------------------------  The patient has received 1 unit of PRBCs and the first liter of fluid.  His blood pressure improved somewhat for time but still remains in the 80s/40s.  Hemoglobin is 6.8 down from 10.4 yesterday.  We will continue fluid resuscitation and blood administration.  I discussed the case with APP Annabelle Harman from the ICU who will come to evaluate the patient.  I have paged GI.  ----------------------------------------- 11:30 PM on 03/15/2020 -----------------------------------------  I consulted Dr. Tobi Bastos from GI who agrees with the current resuscitation and recommends a CT angiogram to evaluate for other sources of bleeding besides the known ectasias.  He will plan for likely endoscopy in the morning unless the patient has any change in his clinical status in the meantime.  Plan is admission to the ICU.   ____________________________________________   FINAL CLINICAL IMPRESSION(S) / ED DIAGNOSES  Final diagnoses:  Gastrointestinal hemorrhage, unspecified gastrointestinal hemorrhage type      NEW MEDICATIONS STARTED DURING THIS VISIT:  New Prescriptions   No medications on file     Note:  This document was prepared using Dragon voice recognition software and may include unintentional dictation errors.   Dionne Bucy, MD 03/16/20 0005

## 2020-03-15 NOTE — Discharge Summary (Signed)
Physician Discharge Summary  Dillon Graves JJK:[id number] DOB: 1950-07-18 DOA: 03/14/2020  PCP: Alan Mulder, MD  Admit date: 03/14/2020 Discharge date: 03/15/2020  Admitted From: Home Disposition: Home  Recommendations for Outpatient Follow-up:  1. Follow up with PCP in 1-2 weeks 2. Follow-up with gastroenterology in 1 to 2 weeks 3. Please obtain BMP/CBC in one week 4. Please follow up on the following pending results: None  Home Health: No Equipment/Devices: None Discharge Condition: Stable CODE STATUS: Full Diet recommendation: Heart Healthy / Carb Modified   Brief/Interim Summary: Dillon Graves is a 70 y.o. male with medical history significant of hypertension, hyperlipidemia, diabetes mellitus, GERD, gout, thrombocytopenia, liver cirrhosis, splenomegaly, CKD stage IIIb, iron deficiency anemia, who presents with dark stool and hematemesis.  Hemoglobin dropped up to 10.4, from baseline of 14.  Remained stable after that. 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  Coagulation was done for hemostasis.  No more bleeding after the procedure.  He was given octreotide for concern of variceal bleed on admission which was discontinued next day.  He will continue with PPI twice daily and follow-up with gastroenterology in 1 to 2 weeks.   Patient has an history of liver cirrhosis without ascites, no sign of decompensation.  We held his home dose of diuretics due to softer blood pressure. Patient can resume on discharge.  He will continue rest of his home medications and follow-up with his providers.  Discharge Diagnoses:  Principal Problem:   GI bleeding Active Problems:   Iron deficiency anemia   GERD (gastroesophageal reflux disease)   Cirrhosis of liver without ascites (HCC)   Gout   Thrombocytopenia (HCC)   Type II diabetes mellitus  with renal manifestations (HCC)   CKD (chronic kidney disease), stage IIIb   HLD (hyperlipidemia)   Discharge Instructions  Discharge Instructions    Diet - low sodium heart healthy   Complete by: As directed    Discharge instructions   Complete by: As directed    It was pleasure taking care of you. Continue with your IV iron infusions. Continue holding aspirin for next couple of days and then restart. Continue with your rest of medications and follow-up with gastroenterology in 1 to 2 weeks. Keep yourself well-hydrated.   Increase activity slowly   Complete by: As directed      Allergies as of 03/15/2020      Reactions   Morphine And Related Nausea And Vomiting      Medication List    TAKE these medications   aspirin 81 MG tablet Take 81 mg by mouth daily.   atorvastatin 80 MG tablet Commonly known as: LIPITOR Take 80 mg by mouth daily.   Basaglar KwikPen 100 UNIT/ML Inject 25-50 Units into the skin See admin instructions. Inject 50u under the skin every morning and inject 25u to 50u under the skin every night based upon blood glucose reading   cycloSPORINE 0.05 % ophthalmic emulsion Commonly known as: RESTASIS Place 1 drop into both eyes 2 (two) times daily.   ergocalciferol 1.25 MG (50000 UT) capsule Commonly known as: VITAMIN D2 Take 50,000 Units by mouth every Saturday.   ezetimibe 10 MG tablet Commonly known as: ZETIA Take 10 mg by mouth daily.   Febuxostat 80 MG Tabs Take 80 mg by mouth daily.   furosemide 80 MG tablet Commonly known as: LASIX Take 40-80 mg by  mouth See admin instructions. Take 1 tablet (80mg ) by mouth every morning and take  tablet (40mg ) by mouth every afternoon   hydrOXYzine 50 MG tablet Commonly known as: ATARAX/VISTARIL Take 50 mg by mouth at bedtime.   Iron-Vitamin C 65-125 MG Tabs Take 1 tablet by mouth as directed.   Janumet XR [phone] MG Tb24 Generic drug: SitaGLIPtin-MetFORMIN HCl Take 1 tablet by mouth 2 (two)  times daily.   Jardiance 25 MG Tabs tablet Generic drug: empagliflozin Take 25 mg by mouth daily.   lactulose 10 GM/15ML solution Commonly known as: CHRONULAC Take 30 g by mouth 2 (two) times daily.   Milk Thistle 500 MG Caps Take 1 capsule by mouth as directed.   multivitamin tablet Take 1 tablet by mouth daily.   nadolol 40 MG tablet Commonly known as: CORGARD Take 40 mg by mouth daily.   nitroGLYCERIN 0.4 MG SL tablet Commonly known as: NITROSTAT Place 0.4 mg under the tongue every 5 (five) minutes as needed for chest pain.   omeprazole 40 MG capsule Commonly known as: PRILOSEC Take 40 mg by mouth 2 (two) times daily.   ondansetron 8 MG disintegrating tablet Commonly known as: Zofran ODT Take 1 tablet (8 mg total) by mouth 2 (two) times daily.   Ozempic (1 MG/DOSE) 4 MG/3ML Sopn Generic drug: Semaglutide (1 MG/DOSE) Inject 1 mg into the skin every Saturday.   Prolensa 0.07 % Soln Generic drug: Bromfenac Sodium Place 1 drop into both eyes 2 (two) times daily.   rifaximin 550 MG Tabs tablet Commonly known as: XIFAXAN Take 550 mg by mouth 2 (two) times daily.   spironolactone 50 MG tablet Commonly known as: ALDACTONE Take 25 mg by mouth daily.   Vascepa 1 g capsule Generic drug: icosapent Ethyl Take 1 g by mouth 2 (two) times daily.   vitamin C 1000 MG tablet Take 1,000 mg by mouth daily.   Zinc 50 MG Tabs Take 100 mg by mouth 2 (two) times daily.       Follow-up Information    Morayati, , MD. Schedule an appointment as soon as possible for a visit.   Specialty: Endocrinology Contact information: [address] [place] [phone]        [id number], MD. Schedule an appointment as soon as possible for a visit in 1 week(s).   Specialty: Gastroenterology Contact information: [address] [place] [phone]              Allergies  Allergen Reactions  . Morphine And Related Nausea  And Vomiting    Consultations:  Gastroenterology  Procedures/Studies:  No results found.  Subjective: Patient was seen and examined today.  He was feeling better, denies any more vomiting, hematemesis or melena.  Did not had any bowel movement since after his EGD. Per patient he is getting IV iron infusions, first dose was on Monday and he is scheduled for second dose in 1 week, upcoming Monday again.  Discussed that he should continue and watch for any bleeding.  Discharge Exam: Vitals:   03/15/20 0823 03/15/20 1144  BP: (!) 92/53 (!) 93/48  Pulse: 69 66  Resp: 18 18  Temp: 98.2 F (36.8 C) 97.6 F (36.4 C)  SpO2: 97% 99%   Vitals:   03/14/20 2030 03/15/20 0459 03/15/20 0823 03/15/20 1144  BP: (!) 99/59 95/60 (!) 92/53 (!) 93/48  Pulse: 65 72 69 66  Resp: 17 18 18 18   Temp: 97.9 F (36.6  C) (!) 97.5 F (36.4 C) 98.2 F (36.8 C) 97.6 F (36.4 C)  TempSrc: Oral Oral  Oral  SpO2: 98% 98% 97% 99%  Weight:      Height:        General: Pt is alert, awake, not in acute distress Cardiovascular: RRR, S1/S2 +, no rubs, no gallops Respiratory: CTA bilaterally, no wheezing, no rhonchi Abdominal: Soft, NT, ND, bowel sounds + Extremities: no edema, no cyanosis   The results of significant diagnostics from this hospitalization (including imaging, microbiology, ancillary and laboratory) are listed below for reference.    Microbiology: Recent Results (from the past 240 hour(s))  Resp Panel by RT-PCR (Flu A&B, Covid) Nasopharyngeal Swab     Status: None   Collection Time: 03/14/20  2:52 PM   Specimen: Nasopharyngeal Swab; Nasopharyngeal(NP) swabs in vial transport medium  Result Value Ref Range Status   SARS Coronavirus 2 by RT PCR NEGATIVE NEGATIVE Final    Comment: (NOTE) SARS-CoV-2 target nucleic acids are NOT DETECTED.  The SARS-CoV-2 RNA is generally detectable in upper respiratory specimens during the acute phase of infection. The lowest concentration of SARS-CoV-2  viral copies this assay can detect is 138 copies/mL. A negative result does not preclude SARS-Cov-2 infection and should not be used as the sole basis for treatment or other patient management decisions. A negative result may occur with  improper specimen collection/handling, submission of specimen other than nasopharyngeal swab, presence of viral mutation(s) within the areas targeted by this assay, and inadequate number of viral copies(<138 copies/mL). A negative result must be combined with clinical observations, patient history, and epidemiological information. The expected result is Negative.  Fact Sheet for Patients:  BloggerCourse.com  Fact Sheet for Healthcare Providers:  SeriousBroker.it  This test is no t yet approved or cleared by the [place] FDA and  has been authorized for detection and/or diagnosis of SARS-CoV-2 by FDA under an Emergency Use Authorization (EUA). This EUA will remain  in effect (meaning this test can be used) for the duration of the COVID-19 declaration under Section 564(b)(1) of the Act, 21 U.S.C.section 360bbb-3(b)(1), unless the authorization is terminated  or revoked sooner.       Influenza A by PCR NEGATIVE NEGATIVE Final   Influenza B by PCR NEGATIVE NEGATIVE Final    Comment: (NOTE) The Xpert Xpress SARS-CoV-2/FLU/RSV plus assay is intended as an aid in the diagnosis of influenza from Nasopharyngeal swab specimens and should not be used as a sole basis for treatment. Nasal washings and aspirates are unacceptable for Xpert Xpress SARS-CoV-2/FLU/RSV testing.  Fact Sheet for Patients: BloggerCourse.com  Fact Sheet for Healthcare Providers: SeriousBroker.it  This test is not yet approved or cleared by the [place] FDA and has been authorized for detection and/or diagnosis of SARS-CoV-2 by FDA under an Emergency Use Authorization  (EUA). This EUA will remain in effect (meaning this test can be used) for the duration of the COVID-19 declaration under Section 564(b)(1) of the Act, 21 U.S.C. section 360bbb-3(b)(1), unless the authorization is terminated or revoked.  Performed at [facility], [address] [place], [place] [address]      Labs: BNP (last 3 results) No results for input(s): BNP in the last 8760 hours. Basic Metabolic Panel: Recent Labs  Lab 03/14/20 1333 03/15/20 0219  NA 141 139  K 4.0 4.4  CL 103 109  CO2 24 18*  GLUCOSE 150* 238*  BUN 37* 38*  CREATININE 1.88* 1.96*  CALCIUM 9.4 7.8*   Liver  Function Tests: Recent Labs  Lab 03/14/20 1333  AST 34  ALT 32  ALKPHOS 82  BILITOT 1.6*  PROT 6.5  ALBUMIN 3.8   No results for input(s): LIPASE, AMYLASE in the last 168 hours. No results for input(s): AMMONIA in the last 168 hours. CBC: Recent Labs  Lab 03/14/20 1333 03/14/20 1813 03/14/20 2054 03/15/20 0219 03/15/20 0522  WBC 11.1* 8.1 8.2 5.8 7.9  HGB 14.2 12.2* 12.3* 10.4* 10.4*  HCT 39.2 34.9* 34.7* 30.5* 29.7*  MCV 94.2 95.9 95.6 97.1 97.1  PLT 106* 71* 61* 55* 66*   Cardiac Enzymes: No results for input(s): CKTOTAL, CKMB, CKMBINDEX, TROPONINI in the last 168 hours. BNP: Invalid input(s): POCBNP CBG: Recent Labs  Lab 03/14/20 1615 03/14/20 1710 03/14/20 2038 03/15/20 0859 03/15/20 1143  GLUCAP 104* 98 95 206* 238*   D-Dimer No results for input(s): DDIMER in the last 72 hours. Hgb A1c No results for input(s): HGBA1C in the last 72 hours. Lipid Profile No results for input(s): CHOL, HDL, LDLCALC, TRIG, CHOLHDL, LDLDIRECT in the last 72 hours. Thyroid function studies No results for input(s): TSH, T4TOTAL, T3FREE, THYROIDAB in the last 72 hours.  Invalid input(s): FREET3 Anemia work up No results for input(s): VITAMINB12, FOLATE, FERRITIN, TIBC, IRON, RETICCTPCT in the last 72 hours. Urinalysis    Component Value Date/Time   COLORURINE STRAW  10/27/2013 0815   APPEARANCEUR CLEAR 10/27/2013 0815   LABSPEC 1.010 10/27/2013 0815   PHURINE 5.0 10/27/2013 0815   GLUCOSEU NEGATIVE 10/27/2013 0815   HGBUR NEGATIVE 10/27/2013 0815   BILIRUBINUR NEGATIVE 10/27/2013 0815   KETONESUR NEGATIVE 10/27/2013 0815   PROTEINUR NEGATIVE 10/27/2013 0815   NITRITE NEGATIVE 10/27/2013 0815   LEUKOCYTESUR NEGATIVE 10/27/2013 0815   Sepsis Labs Invalid input(s): PROCALCITONIN,  WBC,  LACTICIDVEN Microbiology Recent Results (from the past 240 hour(s))  Resp Panel by RT-PCR (Flu A&B, Covid) Nasopharyngeal Swab     Status: None   Collection Time: 03/14/20  2:52 PM   Specimen: Nasopharyngeal Swab; Nasopharyngeal(NP) swabs in vial transport medium  Result Value Ref Range Status   SARS Coronavirus 2 by RT PCR NEGATIVE NEGATIVE Final    Comment: (NOTE) SARS-CoV-2 target nucleic acids are NOT DETECTED.  The SARS-CoV-2 RNA is generally detectable in upper respiratory specimens during the acute phase of infection. The lowest concentration of SARS-CoV-2 viral copies this assay can detect is 138 copies/mL. A negative result does not preclude SARS-Cov-2 infection and should not be used as the sole basis for treatment or other patient management decisions. A negative result may occur with  improper specimen collection/handling, submission of specimen other than nasopharyngeal swab, presence of viral mutation(s) within the areas targeted by this assay, and inadequate number of viral copies(<138 copies/mL). A negative result must be combined with clinical observations, patient history, and epidemiological information. The expected result is Negative.  Fact Sheet for Patients:  BloggerCourse.comhttps://www.fda.gov/media/152166/download  Fact Sheet for Healthcare Providers:  SeriousBroker.ithttps://www.fda.gov/media/152162/download  This test is no t yet approved or cleared by the [place] FDA and  has been authorized for detection and/or diagnosis of SARS-CoV-2 by FDA under an  Emergency Use Authorization (EUA). This EUA will remain  in effect (meaning this test can be used) for the duration of the COVID-19 declaration under Section 564(b)(1) of the Act, 21 U.S.C.section 360bbb-3(b)(1), unless the authorization is terminated  or revoked sooner.       Influenza A by PCR NEGATIVE NEGATIVE Final   Influenza B by PCR NEGATIVE NEGATIVE Final  Comment: (NOTE) The Xpert Xpress SARS-CoV-2/FLU/RSV plus assay is intended as an aid in the diagnosis of influenza from Nasopharyngeal swab specimens and should not be used as a sole basis for treatment. Nasal washings and aspirates are unacceptable for Xpert Xpress SARS-CoV-2/FLU/RSV testing.  Fact Sheet for Patients: BloggerCourse.com  Fact Sheet for Healthcare Providers: SeriousBroker.it  This test is not yet approved or cleared by the [place] FDA and has been authorized for detection and/or diagnosis of SARS-CoV-2 by FDA under an Emergency Use Authorization (EUA). This EUA will remain in effect (meaning this test can be used) for the duration of the COVID-19 declaration under Section 564(b)(1) of the Act, 21 U.S.C. section 360bbb-3(b)(1), unless the authorization is terminated or revoked.  Performed at [facility], [address] [place], [place] [address]     Time coordinating discharge: Over 30 minutes  SIGNED:  Arnetha Courser, MD  [facility] 03/15/2020, 1:10 PM  If 7PM-7AM, please contact night-coverage [email or web address]  This record has been created using Conservation officer, historic buildings. Errors have been sought and corrected,but may not always be located. Such creation errors do not reflect on the standard of care.

## 2020-03-15 NOTE — Progress Notes (Signed)
GI Inpatient Follow-up Note  Subjective:  Patient seen in follow-up for upper GI bleed. 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 No acute events overnight. Pt offers no further complaints. No further episodes of hematemesis or hematochezia. Hemoglobin stable at 10.4 this morning   Scheduled Inpatient Medications:  . vitamin C  1,000 mg Oral Daily  . atorvastatin  80 mg Oral Daily  . cycloSPORINE  1 drop Both Eyes BID  . ezetimibe  10 mg Oral Daily  . febuxostat  80 mg Oral Daily  . hydrOXYzine  50 mg Oral QHS  . icosapent Ethyl  1 g Oral BID  . insulin aspart  0-5 Units Subcutaneous QHS  . insulin aspart  0-9 Units Subcutaneous TID WC  . insulin glargine  25 Units Subcutaneous BID  . ketorolac  1 drop Both Eyes BID  . lactulose  30 g Oral BID  . multivitamin with minerals  1 tablet Oral Daily  . [START ON 04/07/20] pantoprazole  40 mg Intravenous Q12H  . rifaximin  550 mg Oral BID  . zinc sulfate  220 mg Oral BID    Continuous Inpatient Infusions:   . sodium chloride 100 mL/hr at 03/15/20 1017  . cefTRIAXone (ROCEPHIN)  IV    . octreotide  (SANDOSTATIN)    IV infusion 50 mcg/hr (03/14/20 1727)  . pantoprozole (PROTONIX) infusion 8 mg/hr (03/14/20 1738)  . sodium chloride      PRN Inpatient Medications:  hydrALAZINE, nitroGLYCERIN, ondansetron (ZOFRAN) IV  Review of Systems: Constitutional: Weight is stable.  Eyes: No changes in vision. ENT: No oral lesions, sore throat.  GI: see HPI.  Heme/Lymph: No easy bruising.  CV: No chest pain.  GU: No hematuria.  Integumentary: No rashes.  Neuro: No headaches.  Psych: No depression/anxiety.  Endocrine: No heat/cold intolerance.  Allergic/Immunologic: No urticaria.  Resp: No cough, SOB.  Musculoskeletal: No joint swelling.    Physical Examination: BP (!) 93/48 (BP Location: Right Arm)    Pulse 66   Temp 97.6 F (36.4 C) (Oral)   Resp 18   Ht 5\' 9"  (1.753 m)   Wt 96.2 kg   SpO2 99%   BMI 31.31 kg/m  Gen: NAD, alert and oriented x 4 HEENT: PEERLA, EOMI, Neck: supple, no JVD or thyromegaly Chest: CTA bilaterally, no wheezes, crackles, or other adventitious sounds CV: RRR, no m/g/c/r Abd: soft, NT, ND, +BS in all four quadrants; no HSM, guarding, ridigity, or rebound tenderness Ext: no edema, well perfused with 2+ pulses, Skin: no rash or lesions noted Lymph: no LAD  Data: Lab Results  Component Value Date   WBC 7.9 03/15/2020   HGB 10.4 (L) 03/15/2020   HCT 29.7 (L) 03/15/2020   MCV 97.1 03/15/2020   PLT 66 (L) 03/15/2020   Recent Labs  Lab 03/14/20 2054 03/15/20 0219 03/15/20 0522  HGB 12.3* 10.4* 10.4*   Lab Results  Component Value Date   NA 139 03/15/2020   K 4.4 03/15/2020   CL 109 03/15/2020   CO2 18 (L) 03/15/2020   BUN 38 (H) 03/15/2020   CREATININE 1.96 (H) 03/15/2020   Lab Results  Component Value Date   ALT 32 03/14/2020   AST 34 03/14/2020   ALKPHOS 82 03/14/2020   BILITOT 1.6 (H) 03/14/2020   Recent Labs  Lab 03/14/20 1333  APTT 26  INR 1.3*    EGD 03/14/20  Findings: Grade II varices were found in the middle third of the esophagus and in the lower third of the esophagus. No stigmata of active or recent bleeding were noted. No cherry red spots, red wale signs or adherent clots. Estimated blood loss: none. Severe portal hypertensive gastropathy was found in the entire examined stomach. Severe gastric antral vascular ectasia with bleeding was present in the gastric antrum. Coagulation for hemostasis using argon plasma at 0.8 liters/minute and 30 watts was successful. Estimated blood loss was minimal. There is no endoscopic evidence of varices in the cardia. The examined duodenum was normal. The exam was otherwise without abnormality. Impression:             - Grade II esophageal varices. - Portal hypertensive  gastropathy. 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 - Normal examined duodenum. - The examination was otherwise normal. - No specimens collected.   Assessment/Plan:  70 y/o Caucasian male with a PMH of HTN, HLD, diabetes, GERD, CKD Stage IIIb, and cryptogenic cirrhosis presented to the [facility] ED today for concerns over acute GI bleeding in the form of one episode of frank hematemesis and four episodes of painless hematochezia   1. UGI Bleeding - s/p EGD yesterday afternoon with GAVE with bleeding s/p APC. No variceal bleeding.   2. 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 Grade II esophageal varices - no variceal bleeding  4. Moderate portal hypertensive gastropathy  5. Hepatic encephalopathy - no overt HE, on home regimen of Lactulose 30 cc PO BID and Rifaximin 550 mg PO BID  6. Thrombocytopenia - 106K  7. CKD Stage IIIb - followed by [facility]  Recommendations:  - Reviewed EGD procedure results with patient - H&H stable with no evidence of overt GI bleeding - Stable for discharge from a GI standpoint - Can d/c octreotide - Advance diet as tolerated - Follow-up as an outpatient with me in 2 weeks. I will arrange clinic follow-up appointment.   Please call with questions or concerns.   Jacob Moores, PA-C [facility]astroenter[facility] [phone] (Cell)

## 2020-03-15 NOTE — ED Triage Notes (Signed)
Pt presents to the [facility] via EMS from home with c/o GI bleeding. Pt was seen her and admitted 2 days ago for esophageal varices. Pt states that he had varices cauterized and was discharged earlier today. Pt states that he began blood in stool then and episode of bloody emesis. Pt then contacted EMS. EMS states that pt was hypotensive upon arrival to residence.

## 2020-03-15 NOTE — Plan of Care (Signed)
DISCHARGE NOTE HOME Dillon Graves to be discharged home per MD order. Discussed follow up appointments with the patient. Medication list explained in detail. Patient verbalized understanding.  Skin clean, dry and intact without evidence of skin break down, no evidence of skin tears noted. IV catheters discontinued intact. Sites without signs and symptoms of complications. Dressing and pressure applied. Pt denies pain at the site currently. No complaints noted.  An After Visit Summary (AVS) was printed and given to the patient. Patient escorted via wheelchair, and discharged home via private auto.  Arlice Colt, RN

## 2020-03-15 NOTE — H&P (Addendum)
NAME:  Dillon Graves, MRN:  [id number], DOB:  [birth year], LOS: 0 ADMISSION DATE:  03/15/2020, CONSULTATION DATE: 03/15/2020 REFERRING MD: Dr. Marisa Severin, CHIEF COMPLAINT: GI bleed   Brief History:  70 yo male with hx of cryptogenic cirrhosis recently admitted on 03/9 with upper GI bleed secondary to grade II esophageal varices and GAVE s/p EGD and argon plasma coagulation discharged home on 03/10 returned back to the ER on the night of 03/10 with recurrent hematochezia and hematemesis resulting in hemorrhagic shock requiring levophed gtt   History of Present Illness:  This is a 70 yo male with a hx of cryptogenic cirrhosis who presented to [facility] ER on 03/10 via EMS with hematochezia and hematemesis.  He was recently admitted on 03/9 with an upper GI bleed secondary to grade II esophageal varices and GAVE.  He underwent an EGD and argon plasma coagulation on 03/9 and was discharged home on 03/10.  However, he began to have recurrent hematochezia and an episode of hematemesis at home prompting notification of EMS and return to the ER.  EMS reported upon their arrival at pts home he was hypotensive.    ED course  Upon arrival to the ER pt hypotensive sbp 50-70's.  Pt received immediate iv fluid resuscitation and 2 units of emergent blood.  ER physician also initiated octreotide and protonix gtts.  PCCM team recommended discussing case with on call Gastroenterologist Dr. Tobi Bastos to determine if the pt needed to transfer to [facility] for possible TIPS. Dr. Tobi Bastos  recommended CTA Abd Pelvis to rule out other sources of bleeding, and pt could be admitted to [facility]. Despite aggressive blood product and iv fluid resuscitation pt remained hypotensive requiring levophed gtt.  PCCM team contacted for ICU admission.  Past Medical History:  Vertigo Thrombocytopenia  Splenomegaly OSA Portal Venous Hypertension  Osteoarthritis Muscle Weakness  Ischial Bursitis  HTN Hypercholesteremia   Gout GERD Dyspnea Edema Type II Diabetes Mellitus  Constipation  Cirrhosis of the Liver  Carpal Tunnel Syndrome Arthritis  Iron Deficiency Anemia   Significant Hospital Events:  03/11: Pt admitted to ICU with hemorrhagic shock secondary to acute GI bleed requiring levophed gt   Consults:  Intensivist  Gastroenterology   Procedures:  None   Significant Diagnostic Tests:  CTA Abd Pelvis 03/11>>  Micro Data:  Resp Panel by RT-PCR 03/10>>negative   Antimicrobials:  Ceftriaxone 03/11>>  Interim History / Subjective:  Pt states he feels "funny"  Objective   Blood pressure (!) 83/56, pulse 70, temperature 97.8 F (36.6 C), temperature source Oral, resp. rate 20, height 5\' 9"  (1.753 m), weight 96 kg, SpO2 99 %.       No intake or output data in the 24 hours ending 03/15/20 2343 Filed Weights   03/15/20 2209  Weight: 96 kg    Examination: General: acutely ill pale male, resting in bed NAD HENT: supple, no JVD  Lungs: clear throughout, even, non labored Cardiovascular: sinus bradycardia, no R/G, 2+ radial 1+ distal pulses, no edema  Abdomen: +BS x4, soft, slightly tender, non distended  Extremities: normal bulk, moves all extremities  Neuro: alert and oriented, follows commands, PERRLA  Assessment & Plan:   Hemorrhagic shock secondary to recurrent acute GI bleed~underwent EGD and argon plasma coagulation due to upper GI bleed secondary to grade II esophageal varices and GAVE on 03/9 and discharged home 03/10 Thrombocytopenia  Hx: Portal venous hypertension and cryptogenic cirrhosis  Continuous telemetry monitoring  Aggressive iv fluid and blood product resuscitation to maintain  map >65 Hold outpatient antihypertensives Continue octreotide and protonix gtts Gastroenterology consulted appreciate input Serial H&H Transfuse for hgb <7 and/or signs of active signs of bleeding  Continue ceftriaxone for possible SBP  Acute on chronic renal failure  Lactic acidosis   Trend BMP and lactic acidosis  Replace electrolytes as indicated Monitor UOP Avoid nephrotoxic medications   GERD  Continue protonix gtt   Type II diabetes mellitus  CBG q4hrs  SSI   Best practice (evaluated daily)  Diet: NPO for now  Pain/Anxiety/Delirium protocol (if indicated): N/A VAP protocol (if indicated): N/A DVT prophylaxis: SCD's, avoid chemical prophylaxis  GI prophylaxis: protonix gtt  Glucose control: SSI  Mobility: bedrest Disposition: ICU   Goals of Care:  Last date of multidisciplinary goals of care discussion: N/A Family and staff present: Pts wife, care RN, and NP Summary of discussion: discussed pt prognosis and current plan of care  Follow up goals of care discussion due: 03/16/2020 Code Status: Full code  Labs   CBC: Recent Labs  Lab 03/14/20 1813 03/14/20 2054 03/15/20 0219 03/15/20 0522 03/15/20 2213  WBC 8.1 8.2 5.8 7.9 13.0*  NEUTROABS  --   --   --   --  10.1*  HGB 12.2* 12.3* 10.4* 10.4* 6.8*  HCT 34.9* 34.7* 30.5* 29.7* 20.3*  MCV 95.9 95.6 97.1 97.1 99.5  PLT 71* 61* 55* 66* 131*    Basic Metabolic Panel: Recent Labs  Lab 03/14/20 1333 03/15/20 0219 03/15/20 2213  NA 141 139 139  K 4.0 4.4 4.1  CL 103 109 113*  CO2 24 18* 20*  GLUCOSE 150* 238* 204*  BUN 37* 38* 40*  CREATININE 1.88* 1.96* 1.93*  CALCIUM 9.4 7.8* 7.2*   GFR: Estimated Creatinine Clearance: 41.3 mL/min (A) (by C-G formula based on SCr of 1.93 mg/dL (H)). Recent Labs  Lab 03/14/20 2054 03/15/20 0219 03/15/20 0522 03/15/20 2213 03/15/20 2214  WBC 8.2 5.8 7.9 13.0*  --   LATICACIDVEN  --   --   --   --  3.6*    Liver Function Tests: Recent Labs  Lab 03/14/20 1333 03/15/20 2213  AST 34 30  ALT 32 24  ALKPHOS 82 42  BILITOT 1.6* 1.0  PROT 6.5 3.6*  ALBUMIN 3.8 2.2*   No results for input(s): LIPASE, AMYLASE in the last 168 hours. No results for input(s): AMMONIA in the last 168 hours.  ABG No results found for: PHART, PCO2ART, PO2ART,  HCO3, TCO2, ACIDBASEDEF, O2SAT   Coagulation Profile: Recent Labs  Lab 03/14/20 1333 03/15/20 2213  INR 1.3* 1.8*    Cardiac Enzymes: No results for input(s): CKTOTAL, CKMB, CKMBINDEX, TROPONINI in the last 168 hours.  HbA1C: No results found for: HGBA1C  CBG: Recent Labs  Lab 03/14/20 1615 03/14/20 1710 03/14/20 2038 03/15/20 0859 03/15/20 1143  GLUCAP 104* 98 95 206* 238*    Review of Systems: Positives in BOLD   Gen: Denies fever, chills, weight change, fatigue, night sweats HEENT: Denies blurred vision, double vision, hearing loss, tinnitus, sinus congestion, rhinorrhea, sore throat, neck stiffness, dysphagia PULM: Denies shortness of breath, cough, sputum production, hemoptysis, wheezing CV: Denies chest pain, edema, orthopnea, paroxysmal nocturnal dyspnea, palpitations GI: abdominal pain, nausea, hematemesis, vomiting, diarrhea, hematochezia, melena, constipation, change in bowel habits GU: Denies dysuria, hematuria, polyuria, oliguria, urethral discharge Endocrine: Denies hot or cold intolerance, polyuria, polyphagia or appetite change Derm: Denies rash, dry skin, scaling or peeling skin change Heme: Denies easy bruising, bleeding, bleeding gums Neuro: Denies headache, numbness,  weakness, slurred speech, loss of memory or consciousness  Past Medical History:  He,  has a past medical history of Anemia, Anesthesia complication, Arthritis, Carpal tunnel syndrome, Carpal tunnel syndrome, Carpal tunnel syndrome, Chronic kidney disease, Cirrhosis of liver without ascites (HCC), Complication of anesthesia, Constipation, Constipation, Diabetes mellitus without complication (HCC), Dyspnea, Edema, GERD (gastroesophageal reflux disease), Gout, Gout, Hypercholesteremia, Hypertension, Incoordination, Ischial bursitis, Muscle weakness, Osteoarthritis, PONV (postoperative nausea and vomiting), Portal venous hypertension (HCC), Post-operative nausea and vomiting, Sleep apnea, SOB  (shortness of breath), Splenomegaly, Splenomegaly, Thrombocytopenia (HCC), and Vertigo.   Surgical History:   Past Surgical History:  Procedure Laterality Date  . CARPAL TUNNEL RELEASE    . CATARACT EXTRACTION W/ INTRAOCULAR LENS IMPLANT Bilateral   . CHOLECYSTECTOMY  07/15/2017  . COLONOSCOPY    . COLONOSCOPY WITH PROPOFOL N/A 11/16/2019   Procedure: COLONOSCOPY WITH PROPOFOL;  Surgeon: Toledo, Boykin Nearing, MD;  Location: [facility] ENDOSCOPY;  Service: Gastroenterology;  Laterality: N/A;  . ESOPHAGOGASTRODUODENOSCOPY (EGD) WITH PROPOFOL N/A 06/09/2018   Procedure: ESOPHAGOGASTRODUODENOSCOPY (EGD) WITH PROPOFOL;  Surgeon: Toledo, Boykin Nearing, MD;  Location: [facility] ENDOSCOPY;  Service: Gastroenterology;  Laterality: N/A;  . ESOPHAGOGASTRODUODENOSCOPY (EGD) WITH PROPOFOL N/A 09/22/2018   Procedure: ESOPHAGOGASTRODUODENOSCOPY (EGD) WITH PROPOFOL;  Surgeon: Toledo, Boykin Nearing, MD;  Location: [facility] ENDOSCOPY;  Service: Gastroenterology;  Laterality: N/A;  . ESOPHAGOGASTRODUODENOSCOPY (EGD) WITH PROPOFOL N/A 10/20/2018   Procedure: ESOPHAGOGASTRODUODENOSCOPY (EGD) WITH PROPOFOL;  Surgeon: Toledo, Boykin Nearing, MD;  Location: [facility] ENDOSCOPY;  Service: Gastroenterology;  Laterality: N/A;  . ESOPHAGOGASTRODUODENOSCOPY (EGD) WITH PROPOFOL N/A 02/23/2019   Procedure: ESOPHAGOGASTRODUODENOSCOPY (EGD) WITH PROPOFOL;  Surgeon: Toledo, Boykin Nearing, MD;  Location: [facility] ENDOSCOPY;  Service: Gastroenterology;  Laterality: N/A;  . ESOPHAGOGASTRODUODENOSCOPY (EGD) WITH PROPOFOL N/A 02/08/2020   Procedure: ESOPHAGOGASTRODUODENOSCOPY (EGD) WITH PROPOFOL;  Surgeon: Toledo, Boykin Nearing, MD;  Location: [facility] ENDOSCOPY;  Service: Gastroenterology;  Laterality: N/A;  . EYE SURGERY    . JOINT REPLACEMENT Left    Hip  . JOINT REPLACEMENT Right    Knee  . LIVER BIOPSY    . ROTATOR CUFF REPAIR       Social History:   reports that he has never smoked. He has never used smokeless tobacco. He reports that he does not drink alcohol and does not  use drugs.   Family History:  His family history includes Cancer in his father; Heart attack in his brother.   Allergies Allergies  Allergen Reactions  . Morphine And Related Nausea And Vomiting     Home Medications  Prior to Admission medications   Medication Sig Start Date End Date Taking? Authorizing Provider  Ascorbic Acid (VITAMIN C) 1000 MG tablet Take 1,000 mg by mouth daily.   Yes [provider]  aspirin 81 MG tablet Take 81 mg by mouth daily.   Yes [provider]  atorvastatin (LIPITOR) 80 MG tablet Take 80 mg by mouth daily.   Yes [provider]  Bromfenac Sodium (PROLENSA) 0.07 % SOLN Place 1 drop into both eyes 2 (two) times daily.   Yes [provider]  cycloSPORINE (RESTASIS) 0.05 % ophthalmic emulsion Place 1 drop into both eyes 2 (two) times daily.   Yes [provider]  empagliflozin (JARDIANCE) 25 MG TABS tablet Take 25 mg by mouth daily.   Yes [provider]  ergocalciferol (VITAMIN D2) 50000 UNITS capsule Take 50,000 Units by mouth every Saturday.   Yes [provider]  ezetimibe (ZETIA) 10 MG tablet Take 10 mg by mouth daily.   Yes  [provider]  Febuxostat 80 MG TABS Take 80 mg by mouth daily.   Yes [provider]  furosemide (LASIX) 80 MG tablet Take 40-80 mg by mouth See admin instructions. Take 1 tablet (80mg ) by mouth every morning and take  tablet (40mg ) by mouth every afternoon   Yes [provider]  hydrOXYzine (ATARAX/VISTARIL) 50 MG tablet Take 50 mg by mouth at bedtime. 02/22/20  Yes [provider]  Insulin Glargine (BASAGLAR KWIKPEN) 100 UNIT/ML Inject 25-50 Units into the skin See admin instructions. Inject 50u under the skin every morning and inject 25u to 50u under the skin every night based upon blood glucose reading 01/20/20  Yes [provider]  Iron-Vitamin C 65-125 MG TABS Take 1 tablet by mouth as directed.   Yes [provider]  lactulose (CHRONULAC) 10 GM/15ML solution Take 30 g by mouth 2 (two) times daily.   Yes [provider]  Milk Thistle 500 MG CAPS Take 1 capsule by mouth as directed.   Yes [provider]  Multiple Vitamin (MULTIVITAMIN) tablet Take 1 tablet by mouth daily.   Yes [provider]  nadolol (CORGARD) 40 MG tablet Take 40 mg by mouth daily.   Yes [provider]  omeprazole (PRILOSEC) 40 MG capsule Take 40 mg by mouth 2 (two) times daily.   Yes [provider]  OZEMPIC, 1 MG/DOSE, 4 MG/3ML SOPN Inject 1 mg into the skin every Saturday. 02/06/20  Yes [provider]  rifaximin (XIFAXAN) 550 MG TABS tablet Take 550 mg by mouth 2 (two) times daily.   Yes [provider]  SitaGLIPtin-MetFORMIN HCl (JANUMET XR) [phone] MG TB24 Take 1 tablet by mouth 2 (two) times daily.   Yes [provider]  spironolactone (ALDACTONE) 50 MG tablet Take 25 mg by mouth daily.   Yes [provider]  VASCEPA 1 g capsule Take 1 g by mouth 2 (two) times daily. 02/06/20  Yes [provider]  Zinc 50 MG TABS Take 100 mg by mouth 2 (two) times daily.   Yes [provider]  nitroGLYCERIN (NITROSTAT) 0.4 MG SL tablet Place 0.4 mg under the tongue every 5 (five) minutes as needed for chest pain.    [provider]  ondansetron (ZOFRAN ODT) 8 MG disintegrating tablet Take 1 tablet (8 mg total) by mouth 2 (two) times daily. Patient not taking: Reported on 03/15/2020 11/14/14   05/15/2020, MD     Critical care time: 1hr     13/8/16, [facility]  Pulmonary/Critical Care Pager [phone] (please enter 7 digits) PCCM Consult Pager [phone] (please enter 7 digits)

## 2020-03-16 ENCOUNTER — Encounter: Payer: Self-pay | Admitting: Internal Medicine

## 2020-03-16 ENCOUNTER — Inpatient Hospital Stay: Payer: Commercial Managed Care - PPO

## 2020-03-16 ENCOUNTER — Encounter: Payer: Self-pay | Admitting: Certified Registered Nurse Anesthetist

## 2020-03-16 DIAGNOSIS — R578 Other shock: Secondary | ICD-10-CM | POA: Diagnosis not present

## 2020-03-16 DIAGNOSIS — K922 Gastrointestinal hemorrhage, unspecified: Secondary | ICD-10-CM | POA: Diagnosis not present

## 2020-03-16 DIAGNOSIS — K766 Portal hypertension: Secondary | ICD-10-CM | POA: Diagnosis not present

## 2020-03-16 DIAGNOSIS — I8500 Esophageal varices without bleeding: Secondary | ICD-10-CM | POA: Diagnosis not present

## 2020-03-16 LAB — COMPREHENSIVE METABOLIC PANEL
ALT: 25 U/L (ref 0–44)
AST: 31 U/L (ref 15–41)
Albumin: 2.1 g/dL — ABNORMAL LOW (ref 3.5–5.0)
Alkaline Phosphatase: 43 U/L (ref 38–126)
Anion gap: 5 (ref 5–15)
BUN: 38 mg/dL — ABNORMAL HIGH (ref 8–23)
CO2: 19 mmol/L — ABNORMAL LOW (ref 22–32)
Calcium: 6.8 mg/dL — ABNORMAL LOW (ref 8.9–10.3)
Chloride: 114 mmol/L — ABNORMAL HIGH (ref 98–111)
Creatinine, Ser: 1.68 mg/dL — ABNORMAL HIGH (ref 0.61–1.24)
GFR, Estimated: 44 mL/min — ABNORMAL LOW (ref >60)
Glucose, Bld: 275 mg/dL — ABNORMAL HIGH (ref 70–99)
Potassium: 4.4 mmol/L (ref 3.5–5.1)
Sodium: 138 mmol/L (ref 135–145)
Total Bilirubin: 1.3 mg/dL — ABNORMAL HIGH (ref 0.3–1.2)
Total Protein: 3.7 g/dL — ABNORMAL LOW (ref 6.5–8.1)

## 2020-03-16 LAB — CBC WITH DIFFERENTIAL/PLATELET
Abs Immature Granulocytes: 0.55 10*3/uL — ABNORMAL HIGH (ref 0.00–0.07)
Abs Immature Granulocytes: 0.51 10*3/uL — ABNORMAL HIGH (ref 0.00–0.07)
Basophils Absolute: 0.1 10*3/uL (ref 0.0–0.1)
Basophils Absolute: 0.1 10*3/uL (ref 0.0–0.1)
Basophils Relative: 1 %
Basophils Relative: 1 %
Eosinophils Absolute: 0.1 10*3/uL (ref 0.0–0.5)
Eosinophils Absolute: 0.1 10*3/uL (ref 0.0–0.5)
Eosinophils Relative: 1 %
Eosinophils Relative: 1 %
HCT: 22 % — ABNORMAL LOW (ref 39.0–52.0)
HCT: 21.2 % — ABNORMAL LOW (ref 39.0–52.0)
Hemoglobin: 7.6 g/dL — ABNORMAL LOW (ref 13.0–17.0)
Hemoglobin: 7.6 g/dL — ABNORMAL LOW (ref 13.0–17.0)
Immature Granulocytes: 3 %
Immature Granulocytes: 3 %
Lymphocytes Relative: 11 %
Lymphocytes Relative: 11 %
Lymphs Abs: 2.2 10*3/uL (ref 0.7–4.0)
Lymphs Abs: 2.1 10*3/uL (ref 0.7–4.0)
MCH: 32.2 pg (ref 26.0–34.0)
MCH: 32.3 pg (ref 26.0–34.0)
MCHC: 34.5 g/dL (ref 30.0–36.0)
MCHC: 35.8 g/dL (ref 30.0–36.0)
MCV: 93.2 fL (ref 80.0–100.0)
MCV: 90.2 fL (ref 80.0–100.0)
Monocytes Absolute: 3.1 10*3/uL — ABNORMAL HIGH (ref 0.1–1.0)
Monocytes Absolute: 3.1 10*3/uL — ABNORMAL HIGH (ref 0.1–1.0)
Monocytes Relative: 15 %
Monocytes Relative: 16 %
Neutro Abs: 14.5 10*3/uL — ABNORMAL HIGH (ref 1.7–7.7)
Neutro Abs: 14 10*3/uL — ABNORMAL HIGH (ref 1.7–7.7)
Neutrophils Relative %: 69 %
Neutrophils Relative %: 68 %
Platelets: 131 10*3/uL — ABNORMAL LOW (ref 150–400)
Platelets: 134 10*3/uL — ABNORMAL LOW (ref 150–400)
RBC: 2.36 MIL/uL — ABNORMAL LOW (ref 4.22–5.81)
RBC: 2.35 MIL/uL — ABNORMAL LOW (ref 4.22–5.81)
RDW: 15.7 % — ABNORMAL HIGH (ref 11.5–15.5)
RDW: 15.9 % — ABNORMAL HIGH (ref 11.5–15.5)
Smear Review: NORMAL
Smear Review: NORMAL
WBC: 20.6 10*3/uL — ABNORMAL HIGH (ref 4.0–10.5)
WBC: 19.9 10*3/uL — ABNORMAL HIGH (ref 4.0–10.5)
nRBC: 0.4 % — ABNORMAL HIGH (ref 0.0–0.2)
nRBC: 0.8 % — ABNORMAL HIGH (ref 0.0–0.2)

## 2020-03-16 LAB — BLOOD GAS, VENOUS
Acid-base deficit: 7.5 mmol/L — ABNORMAL HIGH (ref 0.0–2.0)
Bicarbonate: 18.1 mmol/L — ABNORMAL LOW (ref 20.0–28.0)
O2 Saturation: 67.6 %
Patient temperature: 37
pCO2, Ven: 36 mmHg — ABNORMAL LOW (ref 44.0–60.0)
pH, Ven: 7.31 (ref 7.250–7.430)
pO2, Ven: 39 mmHg (ref 32.0–45.0)

## 2020-03-16 LAB — PREPARE RBC (CROSSMATCH)

## 2020-03-16 LAB — BLOOD GAS, ARTERIAL
Acid-base deficit: 11.5 mmol/L — ABNORMAL HIGH (ref 0.0–2.0)
Acid-base deficit: 13.4 mmol/L — ABNORMAL HIGH (ref 0.0–2.0)
Bicarbonate: 13.2 mmol/L — ABNORMAL LOW (ref 20.0–28.0)
Bicarbonate: 13 mmol/L — ABNORMAL LOW (ref 20.0–28.0)
FIO2: 0.21
FIO2: 0.5
MECHVT: 500 mL
O2 Saturation: 94.6 %
O2 Saturation: 95.4 %
PEEP: 5 cmH2O
Patient temperature: 37
Patient temperature: 37
RATE: 15 resp/min
pCO2 arterial: 25 mmHg — ABNORMAL LOW (ref 32.0–48.0)
pCO2 arterial: 31 mmHg — ABNORMAL LOW (ref 32.0–48.0)
pH, Arterial: 7.33 — ABNORMAL LOW (ref 7.350–7.450)
pH, Arterial: 7.23 — ABNORMAL LOW (ref 7.350–7.450)
pO2, Arterial: 79 mmHg — ABNORMAL LOW (ref 83.0–108.0)
pO2, Arterial: 92 mmHg (ref 83.0–108.0)

## 2020-03-16 LAB — PROTIME-INR
INR: 1.8 — ABNORMAL HIGH (ref 0.8–1.2)
INR: 2.1 — ABNORMAL HIGH (ref 0.8–1.2)
INR: 2.6 — ABNORMAL HIGH (ref 0.8–1.2)
Prothrombin Time: 20.2 seconds — ABNORMAL HIGH (ref 11.4–15.2)
Prothrombin Time: 22.6 seconds — ABNORMAL HIGH (ref 11.4–15.2)
Prothrombin Time: 27.2 seconds — ABNORMAL HIGH (ref 11.4–15.2)

## 2020-03-16 LAB — LACTIC ACID, PLASMA
Lactic Acid, Venous: 4.9 mmol/L (ref 0.5–1.9)
Lactic Acid, Venous: 7.5 mmol/L (ref 0.5–1.9)

## 2020-03-16 LAB — HEMOGLOBIN A1C
Hgb A1c MFr Bld: 6.5 % — ABNORMAL HIGH (ref 4.8–5.6)
Mean Plasma Glucose: 139.85 mg/dL

## 2020-03-16 LAB — CBC
HCT: 21.9 % — ABNORMAL LOW (ref 39.0–52.0)
Hemoglobin: 7.4 g/dL — ABNORMAL LOW (ref 13.0–17.0)
MCH: 31 pg (ref 26.0–34.0)
MCHC: 33.8 g/dL (ref 30.0–36.0)
MCV: 91.6 fL (ref 80.0–100.0)
Platelets: 98 10*3/uL — ABNORMAL LOW (ref 150–400)
RBC: 2.39 MIL/uL — ABNORMAL LOW (ref 4.22–5.81)
RDW: 15 % (ref 11.5–15.5)
WBC: 24.4 10*3/uL — ABNORMAL HIGH (ref 4.0–10.5)
nRBC: 2 % — ABNORMAL HIGH (ref 0.0–0.2)

## 2020-03-16 LAB — FIBRINOGEN
Fibrinogen: 123 mg/dL — ABNORMAL LOW (ref 210–475)
Fibrinogen: 153 mg/dL — ABNORMAL LOW (ref 210–475)

## 2020-03-16 LAB — GLUCOSE, CAPILLARY
Glucose-Capillary: 240 mg/dL — ABNORMAL HIGH (ref 70–99)
Glucose-Capillary: 303 mg/dL — ABNORMAL HIGH (ref 70–99)
Glucose-Capillary: 186 mg/dL — ABNORMAL HIGH (ref 70–99)
Glucose-Capillary: 304 mg/dL — ABNORMAL HIGH (ref 70–99)

## 2020-03-16 LAB — RESP PANEL BY RT-PCR (FLU A&B, COVID) ARPGX2
Influenza A by PCR: NEGATIVE
Influenza B by PCR: NEGATIVE
SARS Coronavirus 2 by RT PCR: NEGATIVE

## 2020-03-16 LAB — APTT
aPTT: 25 seconds (ref 24–36)
aPTT: 32 seconds (ref 24–36)
aPTT: 40 seconds — ABNORMAL HIGH (ref 24–36)

## 2020-03-16 LAB — TROPONIN I (HIGH SENSITIVITY)
Troponin I (High Sensitivity): 7 ng/L (ref <18)
Troponin I (High Sensitivity): 7 ng/L (ref <18)
Troponin I (High Sensitivity): 7 ng/L (ref <18)

## 2020-03-16 LAB — MRSA PCR SCREENING: MRSA by PCR: NEGATIVE

## 2020-03-16 SURGERY — ESOPHAGOGASTRODUODENOSCOPY (EGD) WITH PROPOFOL
Anesthesia: General

## 2020-03-16 MED ORDER — SODIUM CHLORIDE 0.9% IV SOLUTION: Freq: Once | INTRAVENOUS | Status: DC

## 2020-03-16 MED ORDER — INSULIN ASPART 100 UNIT/ML ~~LOC~~ SOLN: 0.0000 [IU] | SUBCUTANEOUS | 11 refills | Status: AC

## 2020-03-16 MED ORDER — FENTANYL 2500MCG IN NS 250ML (10MCG/ML) PREMIX INFUSION
INTRAVENOUS | Status: AC
  Administered 2020-03-16: 300 ug/h
  Filled 2020-03-16: qty 250

## 2020-03-16 MED ORDER — POLYETHYLENE GLYCOL 3350 17 G PO PACK: 17.0000 g | PACK | Freq: Every day | ORAL | Status: DC

## 2020-03-16 MED ORDER — STERILE WATER FOR INJECTION IV SOLN: 100.0000 mL/h | INTRAVENOUS | 0 refills | Status: AC

## 2020-03-16 MED ORDER — SODIUM CHLORIDE 0.9 % IV SOLN
2.0000 g | INTRAVENOUS | Status: DC
  Administered 2020-03-16: 2 g via INTRAVENOUS
  Filled 2020-03-16: qty 2
  Filled 2020-03-16: qty 20

## 2020-03-16 MED ORDER — INSULIN GLARGINE 100 UNIT/ML ~~LOC~~ SOLN
15.0000 [IU] | Freq: Every day | SUBCUTANEOUS | Status: DC
  Filled 2020-03-16: qty 0.15

## 2020-03-16 MED ORDER — MIDAZOLAM 50MG/50ML (1MG/ML) PREMIX INFUSION: 0.0000 mg/h | INTRAVENOUS | Status: DC

## 2020-03-16 MED ORDER — FENTANYL CITRATE (PF) 100 MCG/2ML IJ SOLN: 25.0000 ug | INTRAMUSCULAR | 0 refills | Status: AC | PRN

## 2020-03-16 MED ORDER — FENTANYL CITRATE (PF) 100 MCG/2ML IJ SOLN: 25.0000 ug | INTRAMUSCULAR | Status: DC | PRN

## 2020-03-16 MED ORDER — KETOROLAC TROMETHAMINE 0.5 % OP SOLN: 1.0000 [drp] | Freq: Two times a day (BID) | OPHTHALMIC | 0 refills | Status: AC

## 2020-03-16 MED ORDER — PHENYLEPHRINE HCL-NACL 10-0.9 MG/250ML-% IV SOLN
0.0000 ug/min | INTRAVENOUS | Status: DC
  Administered 2020-03-16: 50 ug/min via INTRAVENOUS
  Filled 2020-03-16: qty 250

## 2020-03-16 MED ORDER — MIDAZOLAM 50MG/50ML (1MG/ML) PREMIX INFUSION: 0.5000 mg/h | INTRAVENOUS | Status: DC

## 2020-03-16 MED ORDER — SODIUM CHLORIDE 0.9 % IV SOLN
1.0000 g | INTRAVENOUS | Status: DC
  Filled 2020-03-16: qty 10

## 2020-03-16 MED ORDER — MIDAZOLAM BOLUS VIA INFUSION: 1.0000 mg | INTRAVENOUS | Status: AC | PRN

## 2020-03-16 MED ORDER — SODIUM CHLORIDE 0.9 % IV SOLN: 50.0000 ug/h | INTRAVENOUS | Status: AC

## 2020-03-16 MED ORDER — FENTANYL CITRATE (PF) 100 MCG/2ML IJ SOLN
50.0000 ug | Freq: Once | INTRAMUSCULAR | Status: AC
  Administered 2020-03-16: 50 ug via INTRAVENOUS
  Filled 2020-03-16: qty 2

## 2020-03-16 MED ORDER — NOREPINEPHRINE 16 MG/250ML-% IV SOLN
0.0000 ug/min | INTRAVENOUS | Status: DC
  Administered 2020-03-16: 40 ug/min via INTRAVENOUS
  Administered 2020-03-16: 20 ug/min via INTRAVENOUS
  Filled 2020-03-16 (×3): qty 250

## 2020-03-16 MED ORDER — CYCLOSPORINE 0.05 % OP EMUL: 1.0000 [drp] | Freq: Two times a day (BID) | OPHTHALMIC | Status: AC

## 2020-03-16 MED ORDER — SODIUM CHLORIDE 0.9 % IV SOLN: 1.0000 g | INTRAVENOUS | Status: AC

## 2020-03-16 MED ORDER — FENTANYL CITRATE (PF) 100 MCG/2ML IJ SOLN
25.0000 ug | INTRAMUSCULAR | Status: DC | PRN
Start: 2020-03-16 — End: 2020-03-16

## 2020-03-16 MED ORDER — ETOMIDATE 2 MG/ML IV SOLN
INTRAVENOUS | Status: AC
  Administered 2020-03-16: 20 mg
  Filled 2020-03-16: qty 10

## 2020-03-16 MED ORDER — LIDOCAINE HCL 1 % IJ SOLN
10.0000 mL | Freq: Once | INTRAMUSCULAR | Status: AC
  Administered 2020-03-16: 10 mL via INTRADERMAL

## 2020-03-16 MED ORDER — FENTANYL CITRATE (PF) 100 MCG/2ML IJ SOLN
INTRAMUSCULAR | Status: AC
  Filled 2020-03-16: qty 2

## 2020-03-16 MED ORDER — ROCURONIUM BROMIDE 50 MG/5ML IV SOLN
INTRAVENOUS | Status: AC
  Administered 2020-03-16: 50 mg
  Filled 2020-03-16: qty 1

## 2020-03-16 MED ORDER — MIDAZOLAM HCL 2 MG/2ML IJ SOLN: 2.0000 mg | INTRAMUSCULAR | Status: DC | PRN

## 2020-03-16 MED ORDER — ONDANSETRON HCL 4 MG/2ML IJ SOLN: 4.0000 mg | Freq: Four times a day (QID) | INTRAMUSCULAR | 0 refills | Status: AC | PRN

## 2020-03-16 MED ORDER — ROCURONIUM BROMIDE 50 MG/5ML IV SOLN: 50.0000 mg | Freq: Once | INTRAVENOUS | Status: DC

## 2020-03-16 MED ORDER — NOREPINEPHRINE 16 MG/250ML-% IV SOLN: 0.0000 ug/min | INTRAVENOUS | Status: AC

## 2020-03-16 MED ORDER — IOHEXOL 350 MG/ML SOLN
80.0000 mL | Freq: Once | INTRAVENOUS | Status: AC | PRN
  Administered 2020-03-16: 80 mL via INTRAVENOUS

## 2020-03-16 MED ORDER — ETOMIDATE 2 MG/ML IV SOLN: 20.0000 mg | Freq: Once | INTRAVENOUS | Status: DC

## 2020-03-16 MED ORDER — SODIUM CHLORIDE 0.9 % IV SOLN: 2.0000 g | INTRAVENOUS | Status: AC

## 2020-03-16 MED ORDER — MIDAZOLAM BOLUS VIA INFUSION
1.0000 mg | INTRAVENOUS | Status: DC | PRN
  Filled 2020-03-16: qty 2

## 2020-03-16 MED ORDER — VITAMIN K1 10 MG/ML IJ SOLN
10.0000 mg | Freq: Once | INTRAVENOUS | Status: AC
  Administered 2020-03-16: 10 mg via INTRAVENOUS
  Filled 2020-03-16: qty 1

## 2020-03-16 MED ORDER — INSULIN GLARGINE 100 UNIT/ML ~~LOC~~ SOLN
25.0000 [IU] | Freq: Every day | SUBCUTANEOUS | Status: DC
  Administered 2020-03-16: 25 [IU] via SUBCUTANEOUS
  Filled 2020-03-16 (×2): qty 0.25

## 2020-03-16 MED ORDER — FENTANYL CITRATE (PF) 100 MCG/2ML IJ SOLN
25.0000 ug | INTRAMUSCULAR | Status: DC | PRN
Start: 2020-03-16 — End: 2020-03-16
  Administered 2020-03-16: 25 ug via INTRAVENOUS
  Filled 2020-03-16: qty 2

## 2020-03-16 MED ORDER — MIDAZOLAM 50MG/50ML (1MG/ML) PREMIX INFUSION: 0.0000 mg/h | INTRAVENOUS | Status: AC

## 2020-03-16 MED ORDER — SODIUM BICARBONATE 8.4 % IV SOLN
50.0000 meq | Freq: Once | INTRAVENOUS | Status: AC
  Administered 2020-03-16: 50 meq via INTRAVENOUS

## 2020-03-16 MED ORDER — FENTANYL CITRATE (PF) 100 MCG/2ML IJ SOLN
100.0000 ug | Freq: Once | INTRAMUSCULAR | Status: AC
  Administered 2020-03-16: 100 ug via INTRAVENOUS

## 2020-03-16 MED ORDER — DOCUSATE SODIUM 50 MG/5ML PO LIQD: 100.0000 mg | Freq: Two times a day (BID) | ORAL | Status: DC

## 2020-03-16 MED ORDER — FENTANYL CITRATE (PF) 100 MCG/2ML IJ SOLN
25.0000 ug | Freq: Once | INTRAMUSCULAR | Status: AC
  Administered 2020-03-16: 25 ug via INTRAVENOUS

## 2020-03-16 MED ORDER — CHLORHEXIDINE GLUCONATE CLOTH 2 % EX PADS: 6.0000 | MEDICATED_PAD | Freq: Every day | CUTANEOUS | Status: DC

## 2020-03-16 MED ORDER — MIDAZOLAM HCL 2 MG/2ML IJ SOLN
INTRAMUSCULAR | Status: AC
  Administered 2020-03-16: 2 mg
  Filled 2020-03-16: qty 2

## 2020-03-16 MED ORDER — LACTATED RINGERS IV BOLUS
1000.0000 mL | Freq: Once | INTRAVENOUS | Status: AC
  Administered 2020-03-16: 1000 mL via INTRAVENOUS

## 2020-03-16 MED ORDER — HYDROCORTISONE NA SUCCINATE PF 100 MG IJ SOLR
100.0000 mg | Freq: Once | INTRAMUSCULAR | Status: AC
  Administered 2020-03-16: 100 mg via INTRAVENOUS
  Filled 2020-03-16: qty 2

## 2020-03-16 MED ORDER — INSULIN ASPART 100 UNIT/ML ~~LOC~~ SOLN
0.0000 [IU] | SUBCUTANEOUS | Status: DC
  Administered 2020-03-16: 3 [IU] via SUBCUTANEOUS
  Administered 2020-03-16 (×2): 7 [IU] via SUBCUTANEOUS
  Filled 2020-03-16 (×3): qty 1

## 2020-03-16 MED ORDER — VASOPRESSIN 20 UNIT/ML IV SOLN: 0.0000 [IU]/min | INTRAVENOUS | Status: AC

## 2020-03-16 MED ORDER — CYCLOSPORINE 0.05 % OP EMUL
1.0000 [drp] | Freq: Two times a day (BID) | OPHTHALMIC | Status: DC
  Administered 2020-03-16: 1 [drp] via OPHTHALMIC
  Filled 2020-03-16 (×3): qty 1

## 2020-03-16 MED ORDER — SODIUM CHLORIDE 0.9 % IV BOLUS
1000.0000 mL | Freq: Once | INTRAVENOUS | Status: AC
  Administered 2020-03-16: 1000 mL via INTRAVENOUS

## 2020-03-16 MED ORDER — KETOROLAC TROMETHAMINE 0.5 % OP SOLN
1.0000 [drp] | Freq: Two times a day (BID) | OPHTHALMIC | Status: DC
  Filled 2020-03-16: qty 3

## 2020-03-16 MED ORDER — STERILE WATER FOR INJECTION IV SOLN
INTRAVENOUS | Status: DC
  Filled 2020-03-16 (×2): qty 850
  Filled 2020-03-16: qty 150
  Filled 2020-03-16: qty 850

## 2020-03-16 NOTE — Progress Notes (Signed)
Discussed CTA Abd Pelvis findings with on call Gastroenterologist Dr. Tobi Bastos. Dr. Tobi Bastos requested  vascular surgery consult to assess if they could place a direct portal-inferior vena caval shunt as recommended per CT reading.  Will contact on call vascular surgeon Dr. Wyn Quaker.  I also informed Dr. Tobi Bastos the pt continues to have multiple episodes of hematochezia.  Dr. Tobi Bastos requested NG tube placement for decompression  Sonda Rumble, AGNP  Pulmonary/Critical Care Pager [phone] (please enter 7 digits) PCCM Consult Pager [phone] (please enter 7 digits)    .

## 2020-03-16 NOTE — Progress Notes (Signed)
Per the chest xray and order from Harlon Ditty NP, the ETT was pulled back 1 cm and is now at 23 at the lip.

## 2020-03-16 NOTE — Progress Notes (Signed)
Per Debera Lat. NP transfuse blood and Cryo wide open. Continue to monitor.

## 2020-03-16 NOTE — Procedures (Signed)
Arterial Catheter Insertion Procedure Note  Dillon Graves  [id number]  [birth year]  Date:03/16/20  Time:9:52 AM    Provider Performing: Judithe Modest    Procedure: Insertion of Arterial Line (34193) with US guidance (79024)   Indication(s) Blood pressure monitoring and/or need for frequent ABGs  Consent Risks of the procedure as well as the alternatives and risks of each were explained to the patient and/or caregiver.  Consent for the procedure was obtained and is signed in the bedside chart  Anesthesia None   Time Out Verified patient identification, verified procedure, site/side was marked, verified correct patient position, special equipment/implants available, medications/allergies/relevant history reviewed, required imaging and test results available.   Sterile Technique Maximal sterile technique including full sterile barrier drape, hand hygiene, sterile gown, sterile gloves, mask, hair covering, sterile ultrasound probe cover (if used).   Procedure Description Area of catheter insertion was cleaned with chlorhexidine and draped in sterile fashion. With real-time ultrasound guidance an arterial catheter was placed into the left femoral artery.  Appropriate arterial tracings confirmed on monitor.     Complications/Tolerance None; patient tolerated the procedure well.   EBL Minimal   Specimen(s) None   BIOPATCH applied to the insertion site.     Harlon Ditty, AGACNP-BC [place] [facility] Pager: [phone]

## 2020-03-16 NOTE — Progress Notes (Signed)
EGD/enteroscopy not performed as the [facility] arrived to pick up patient  Dillon Donath, MD

## 2020-03-16 NOTE — Progress Notes (Signed)
Inpatient Diabetes Program Recommendations  AACE/ADA: New Consensus Statement on Inpatient Glycemic Control (2015)  Target Ranges:  Prepandial:   less than 140 mg/dL      Peak postprandial:   less than 180 mg/dL (1-2 hours)      Critically ill patients:  140 - 180 mg/dL   Results for BO, TEICHER (MRN [id number]) as of 03/16/2020 08:00  Ref. Range 03/16/2020 03:42 03/16/2020 07:58  Glucose-Capillary Latest Ref Range: 70 - 99 mg/dL 970 (H)  3 units NOVOLOG  303 (H)  7 units NOVOLOG     History of cryptogenic cirrhosis recently admitted on 03/09 with upper GI bleed secondary to grade II esophageal varices and GAVE s/p EGD and argon plasma coagulation discharged home on 03/10 returned back to the ER on the night of 03/10 with recurrent hematochezia and hematemesis resulting in hemorrhagic shock requiring levophed gtt  History: DM  Home DM Meds: Jardiance 25 mg Daily       Basaglar 50 units AM/ 25-50 units PM       Ozempic 1 mg Qweek       Janumet 100/1000 mg BID  Current Orders: Novolog Sensitive Correction Scale/ SSI (0-9 units) Q4 hours     MD- CBGs >200 since 4am.  Patient takes Basaglar basal insulin at home BID  Please consider starting Lantus 25 units AM/ 15 units PM  This would be approximately 50% total home dose to start     --Will follow patient during hospitalization--  Ambrose Finland RN, MSN, CDE Diabetes Coordinator Inpatient Glycemic Control Team Team Pager: [phone] (8a-5p)

## 2020-03-16 NOTE — Progress Notes (Signed)
Notified on call Gastroenterologist Dr. Tobi Bastos via telephone pt has had 3 episodes of hematochezia, 2nd episode an estimated 400 ml of bright red blood per rectum.  Pt has received multiple blood products including 4 units of pRBC's and 2 units of FFP.  He is currently requiring vasopressin and levophed gtts to maintain map >65 and has been too unstable to travel to [place].  Pt is also pending platelet transfusion once platelets arrive.  Dr. Tobi Bastos agreed with repeating CBC, and if stable transport pt to CT for imaging of Abd and Pelvis to determine if there is a new source of bleeding. Will contact Dr. Tobi Bastos again once test have resulted.  Will continue to monitor and assess pt.  Sonda Rumble, AGNP  Pulmonary/Critical Care Pager [phone] (please enter 7 digits) PCCM Consult Pager [phone] (please enter 7 digits)

## 2020-03-16 NOTE — Procedures (Signed)
Central Venous Catheter Insertion Procedure Note  YARNELL ARVIDSON  [id number]  Mar 28, 1950  Date:03/16/20  Time:2:01 AM   Provider Performing:[facility]   Procedure: Insertion of Non-tunneled Central Venous [phone]) with US guidance (29518)   Indication(s) Medication administration  Consent Risks of the procedure as well as the alternatives and risks of each were explained to the patient and/or caregiver.  Consent for the procedure was obtained and is signed in the bedside chart  Anesthesia Topical only with 1% lidocaine   Timeout Verified patient identification, verified procedure, site/side was marked, verified correct patient position, special equipment/implants available, medications/allergies/relevant history reviewed, required imaging and test results available.  Sterile Technique Maximal sterile technique including full sterile barrier drape, hand hygiene, sterile gown, sterile gloves, mask, hair covering, sterile ultrasound probe cover (if used).  Procedure Description Area of catheter insertion was cleaned with chlorhexidine and draped in sterile fashion.  With real-time ultrasound guidance a central venous catheter was placed into the left internal jugular vein. Nonpulsatile blood flow and easy flushing noted in all ports.  The catheter was sutured in place and sterile dressing applied.  Complications/Tolerance None; patient tolerated the procedure well. Chest X-ray is ordered to verify placement for internal jugular or subclavian cannulation.   Chest x-ray is not ordered for femoral cannulation.  EBL Minimal  Specimen(s) None  Sonda Rumble, AGNP  Pulmonary/Critical Care Pager [phone] (please enter 7 digits) PCCM Consult Pager [phone] (please enter 7 digits)

## 2020-03-16 NOTE — Progress Notes (Signed)
Patient being transferred to [facility]. Report given to [facility] [phone]. [facility]- Almeta Monas [phone]. Patient will be going to [address]. Patient intubated around noon by ICU MD assisted by charge nurse. Per MD blood transfusion given wide open before transport. [facility] took over care once arrived 14:00. Family updated.

## 2020-03-16 NOTE — Procedures (Signed)
Intubation Procedure Note  DEPAUL ARIZPE  [id number]  Jun 28, 1950  Date:03/16/20  Time:12:02 PM   Provider Performing:Enrique Weiss D Elvina Sidle    Procedure: Intubation (31500)  Indication(s) Respiratory Failure  Consent Risks of the procedure as well as the alternatives and risks of each were explained to the patient and/or caregiver.  Consent for the procedure was obtained and is signed in the bedside chart   Anesthesia Etomidate, Fentanyl and Rocuronium   Time Out Verified patient identification, verified procedure, site/side was marked, verified correct patient position, special equipment/implants available, medications/allergies/relevant history reviewed, required imaging and test results available.   Sterile Technique Usual hand hygeine, masks, and gloves were used   Procedure Description Patient positioned in bed supine.  Sedation given as noted above.  Patient was intubated with endotracheal tube using Glidescope.  View was Grade 1 full glottis .  Number of attempts was 1.  Colorimetric CO2 detector was consistent with tracheal placement.   Complications/Tolerance None; patient tolerated the procedure well. Chest X-ray is ordered to verify placement.   EBL Minimal   Specimen(s) None   Size 8.0 ETT  Tube was secured at 24 cm at the lip     Harlon Ditty, AGACNP-BC [place] [facility] Pager: [phone]

## 2020-03-16 NOTE — Consult Note (Signed)
Cephas Darby, MD [address]  [place], [place] [address]  Main: [phone]  Fax: [phone] Pager: [phone]   Consultation  Referring Provider:     No ref. provider found Primary Care Physician:  Lenard Simmer, MD Primary Gastroenterologist:  Dr. [name]         Reason for Consultation:     Anemia, melena, hemorrhagic shock  Date of Admission:  03/15/2020 Date of Consultation:  03/16/2020         HPI:   Dillon Graves is a 70 y.o. 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  Patient was hypotensive at home to EMS.  Patient was just discharged from hospital yesterday when he was admitted with hematemesis and hematochezia, underwent upper endoscopy on 3/9 which revealed small esophageal varices with extensive scarring, severe portal hypertensive gastropathy and GAVE that were oozing, APC was performed.   He was found to have an acute drop in his hemoglobin from 10.4 yesterday morning to 6.8 on arrival to ER in the evening.  Patient was started on vasopressors, received blood transfusion and GI, Dr Vicente Males was consulted overnight.  CT angio was recommended which revealed as below.  Patient is kept n.p.o., started on pantoprazole drip, octreotide drip, ceftriaxone for SBP prophylaxis.  On-call vascular surgeon, Dr. Leotis Pain was consulted right away to evaluate for TIPS and recommendation was made to transfer to tertiary care center.  Patient is admitted to ICU and transfer to tertiary care center was initiated given mesenteric venous vascular congestion.  Patient's family preferred to be transferred to [facility].  I spoke to the ICU attending this morning.  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 Relatively  diminutive venous collateralization. Dominant collateral arises from multiple prominent duodenal varices and is a mesenteric-right gonadal portosystemic collateral.  No active gastrointestinal hemorrhage identified  NON-VASCULAR  Cirrhosis with progressive parenchymal volume loss and increasing ascites when compared to prior examination of 10/26/2019.  Resolved splenomegaly, of unclear etiology.  Diffuse marked bowel wall thickening and periluminal edema involving the duodenum, small bowel, and much of the colon and certainly representing mesenteric venous vascular congestion secondary to intrahepatic and pre hepatic portal venous hypertension.  Vascular interventional radiology consultation may be helpful to assess for the potential for direct portal-inferior vena caval shunt creation utilizing small portal venous collaterals as a potential target. Alternatively, surgical consultation could be considered for creation of a potential warrant shunt given the patency of the splenic vein and left renal vein. Either shunt would allow decompression of the mesenteric venous system for treatment of both extensive bowel edema and decompression of the duodenal varices.  When I interviewed the patient, he is on 2 pressors, mentating well, able to answer questions appropriately, he had another episode of hematochezia in the morning, receiving cryoprecipitate.  Transfer to tertiary care has been initiated.  He reports his last day of hematemesis was on Wednesday of this week   NSAIDs: None  Antiplts/Anticoagulants/Anti thrombotics: None  GI Procedures: Reviewed under procedures tab  Past Medical History:  Diagnosis Date  . Anemia    IDA  . Anesthesia complication   . Arthritis   . Carpal tunnel syndrome   . Carpal tunnel syndrome    RIGHT HAND  . Carpal tunnel syndrome   .  Chronic kidney disease    STAGE 3  . Cirrhosis of liver without ascites ([place])   . Complication of  anesthesia    SLOW TO WAKE UP  . Constipation   . Constipation   . Diabetes mellitus without complication ([place])   . Dyspnea   . Edema   . GERD (gastroesophageal reflux disease)   . Gout   . Gout   . Hypercholesteremia   . Hypertension   . Incoordination   . Ischial bursitis   . Muscle weakness   . Osteoarthritis   . PONV (postoperative nausea and vomiting)   . Portal venous hypertension ([facility])   . Post-operative nausea and vomiting   . Sleep apnea   . SOB (shortness of breath)   . Splenomegaly   . Splenomegaly   . Thrombocytopenia ([place])   . Vertigo     Past Surgical History:  Procedure Laterality Date  . CARPAL TUNNEL RELEASE    . CATARACT EXTRACTION W/ INTRAOCULAR LENS IMPLANT Bilateral   . CHOLECYSTECTOMY  07/15/2017  . COLONOSCOPY    . COLONOSCOPY WITH PROPOFOL N/A 11/16/2019   Procedure: COLONOSCOPY WITH PROPOFOL;  Surgeon: Toledo, Benay Pike, MD;  Location: [facility] ENDOSCOPY;  Service: Gastroenterology;  Laterality: N/A;  . ESOPHAGOGASTRODUODENOSCOPY N/A 03/14/2020   Procedure: ESOPHAGOGASTRODUODENOSCOPY (EGD);  Surgeon: Toledo, Benay Pike, MD;  Location: [facility] ENDOSCOPY;  Service: Gastroenterology;  Laterality: N/A;  . ESOPHAGOGASTRODUODENOSCOPY (EGD) WITH PROPOFOL N/A 06/09/2018   Procedure: ESOPHAGOGASTRODUODENOSCOPY (EGD) WITH PROPOFOL;  Surgeon: Toledo, Benay Pike, MD;  Location: [facility] ENDOSCOPY;  Service: Gastroenterology;  Laterality: N/A;  . ESOPHAGOGASTRODUODENOSCOPY (EGD) WITH PROPOFOL N/A 09/22/2018   Procedure: ESOPHAGOGASTRODUODENOSCOPY (EGD) WITH PROPOFOL;  Surgeon: Toledo, Benay Pike, MD;  Location: [facility] ENDOSCOPY;  Service: Gastroenterology;  Laterality: N/A;  . ESOPHAGOGASTRODUODENOSCOPY (EGD) WITH PROPOFOL N/A 10/20/2018   Procedure: ESOPHAGOGASTRODUODENOSCOPY (EGD) WITH PROPOFOL;  Surgeon: Toledo, Benay Pike, MD;  Location: [facility] ENDOSCOPY;  Service: Gastroenterology;  Laterality: N/A;  . ESOPHAGOGASTRODUODENOSCOPY (EGD) WITH PROPOFOL N/A 02/23/2019   Procedure:  ESOPHAGOGASTRODUODENOSCOPY (EGD) WITH PROPOFOL;  Surgeon: Toledo, Benay Pike, MD;  Location: [facility] ENDOSCOPY;  Service: Gastroenterology;  Laterality: N/A;  . ESOPHAGOGASTRODUODENOSCOPY (EGD) WITH PROPOFOL N/A 02/08/2020   Procedure: ESOPHAGOGASTRODUODENOSCOPY (EGD) WITH PROPOFOL;  Surgeon: Toledo, Benay Pike, MD;  Location: [facility] ENDOSCOPY;  Service: Gastroenterology;  Laterality: N/A;  . EYE SURGERY    . JOINT REPLACEMENT Left    Hip  . JOINT REPLACEMENT Right    Knee  . LIVER BIOPSY    . ROTATOR CUFF REPAIR      Prior to Admission medications   Medication Sig Start Date End Date Taking? Authorizing Provider  Ascorbic Acid (VITAMIN C) 1000 MG tablet Take 1,000 mg by mouth daily.   Yes [provider]  aspirin 81 MG tablet Take 81 mg by mouth daily.   Yes [provider]  atorvastatin (LIPITOR) 80 MG tablet Take 80 mg by mouth daily.   Yes [provider]  cycloSPORINE (RESTASIS) 0.05 % ophthalmic emulsion Place 1 drop into both eyes 2 (two) times daily.   Yes [provider]  empagliflozin (JARDIANCE) 25 MG TABS tablet Take 25 mg by mouth daily.   Yes [provider]  ergocalciferol (VITAMIN D2) 50000 UNITS capsule Take 50,000 Units by mouth every Saturday.   Yes [provider]  ezetimibe (ZETIA) 10 MG tablet Take 10 mg by mouth daily.   Yes [provider]  Febuxostat 80 MG TABS Take 80 mg by mouth daily.   Yes [provider]  furosemide (LASIX) 80 MG tablet Take 40-80 mg by mouth See admin instructions. Take 1 tablet (97m) by mouth every morning and take  tablet (45m by mouth every afternoon   Yes [provider]  hydrOXYzine (ATARAX/VISTARIL) 50 MG tablet Take 50 mg by mouth at bedtime. 02/22/20  Yes [provider]  Insulin Glargine (BASAGLAR KWIKPEN) 100 UNIT/ML Inject 25-50 Units into the skin See admin instructions. Inject 50u under the skin every morning and inject 25u to 50u under the skin  every night based upon blood glucose reading 01/20/20  Yes [provider]  Iron-Vitamin C 65-125 MG TABS Take 1 tablet by mouth as directed.   Yes [provider]  lactulose (CHRONULAC) 10 GM/15ML solution Take 30 g by mouth 2 (two) times daily.   Yes [provider]  Milk Thistle 500 MG CAPS Take 1 capsule by mouth as directed.   Yes [provider]  Multiple Vitamin (MULTIVITAMIN) tablet Take 1 tablet by mouth daily.   Yes [provider]  nadolol (CORGARD) 40 MG tablet Take 40 mg by mouth daily.   Yes [provider]  omeprazole (PRILOSEC) 40 MG capsule Take 40 mg by mouth 2 (two) times daily.   Yes [provider]  OZEMPIC, 1 MG/DOSE, 4 MG/3ML SOPN Inject 1 mg into the skin every Saturday. 02/06/20  Yes [provider]  rifaximin (XIFAXAN) 550 MG TABS tablet Take 550 mg by mouth 2 (two) times daily.   Yes [provider]  SitaGLIPtin-MetFORMIN HCl (JANUMET XR) 604 520 7296 MG TB24 Take 1 tablet by mouth 2 (two) times daily.   Yes [provider]  spironolactone (ALDACTONE) 50 MG tablet Take 25 mg by mouth daily.   Yes [provider]  VASCEPA 1 g capsule Take 1 g by mouth 2 (two) times daily. 02/06/20  Yes [provider]  Zinc 50 MG TABS Take 100 mg by mouth 2 (two) times daily.   Yes [provider]  Bromfenac Sodium (PROLENSA) 0.07 % SOLN Place 1 drop into both eyes 2 (two) times daily. Patient not taking: Reported on 03/16/2020    [provider]  cefTRIAXone 1 g in sodium chloride 0.9 % 100 mL Inject 1 g into the vein daily. 03/16/20   BlAwilda BillNP  cycloSPORINE (RESTASIS) 0.05 % ophthalmic emulsion Place 1 drop into both eyes 2 (two) times daily. 03/16/20   BlAwilda BillNP  insulin aspart (NOVOLOG) 100 UNIT/ML injection Inject 0-9 Units into the skin every 4 (four) hours. 03/16/20   BlAwilda BillNP  ketorolac (ACULAR) 0.5 % ophthalmic solution Place 1 drop  into both eyes 2 (two) times daily. 03/17/20   BlAwilda BillNP  nitroGLYCERIN (NITROSTAT) 0.4 MG SL tablet Place 0.4 mg under the tongue every 5 (five) minutes as needed for chest pain.    [provider]  norepinephrine (LEVOPHED) 16-5 MG/250ML-% SOLN Inject 0-40 mcg/min into the vein continuous. 03/16/20   BlAwilda BillNP  octreotide 500 mcg in sodium chloride 0.9 % 250 mL Inject 50 mcg/hr into the vein continuous. 03/16/20   BlAwilda BillNP  ondansetron (ZOFRAN ODT) 8 MG disintegrating tablet Take 1 tablet (8 mg total) by mouth 2 (two) times daily. Patient not taking: Reported on 03/15/2020 11/14/14   CoNorval GableMD  ondansetron ([facility] MG/2ML SOLN injection Inject 2 mLs (4 mg total) into the vein every 6 (six) hours as needed for nausea. 03/16/20  Awilda Bill, NP  vasopressin 50 Units in dextrose 5 % 250 mL Inject 0-0.8 Units/min into the vein continuous. 03/16/20   Awilda Bill, NP    Current Facility-Administered Medications:  .  0.9 %  sodium chloride infusion (Manually program via Guardrails IV Fluids), , Intravenous, Once, Blakeney, Dreama Saa, NP .  0.9 %  sodium chloride infusion (Manually program via Guardrails IV Fluids), , Intravenous, Once, Blakeney, Dreama Saa, NP .  0.9 %  sodium chloride infusion (Manually program via Guardrails IV Fluids), , Intravenous, Once, Blakeney, Dreama Saa, NP .  0.9 %  sodium chloride infusion (Manually program via Guardrails IV Fluids), , Intravenous, Once, Blakeney, Dreama Saa, NP .  0.9 %  sodium chloride infusion (Manually program via Guardrails IV Fluids), , Intravenous, Once, Blakeney, Dreama Saa, NP .  0.9 %  sodium chloride infusion (Manually program via Guardrails IV Fluids), , Intravenous, Once, Blakeney, Dreama Saa, NP .  0.9 %  sodium chloride infusion (Manually program via Guardrails IV Fluids), , Intravenous, Once, Blakeney, Dreama Saa, NP .  0.9 %  sodium chloride infusion (Manually program via Guardrails IV Fluids), , Intravenous,  Once, Tyler Pita, MD .  0.9 %  sodium chloride infusion, 250 mL, Intravenous, Continuous, Blakeney, Dreama Saa, NP .  cefTRIAXone (ROCEPHIN) 2 g in sodium chloride 0.9 % 100 mL IVPB, 2 g, Intravenous, Q24H, Dallie Piles, RPH .  cycloSPORINE (RESTASIS) 0.05 % ophthalmic emulsion 1 drop, 1 drop, Both Eyes, BID, Blakeney, Dana G, NP, 1 drop at 03/16/20 0200 .  fentaNYL (SUBLIMAZE) 100 MCG/2ML injection, , , ,  .  fentaNYL (SUBLIMAZE) injection 25 mcg, 25 mcg, Intravenous, Q2H PRN, Awilda Bill, NP, 25 mcg at 03/16/20 0230 .  insulin aspart (novoLOG) injection 0-9 Units, 0-9 Units, Subcutaneous, Q4H, Awilda Bill, NP, 7 Units at 03/16/20 0806 .  insulin glargine (LANTUS) injection 15 Units, 15 Units, Subcutaneous, QHS, Dallie Piles, RPH .  insulin glargine (LANTUS) injection 25 Units, 25 Units, Subcutaneous, Daily, Dallie Piles, RPH, 25 Units at 03/16/20 1025 .  norepinephrine (LEVOPHED) 16 mg in 229m premix infusion, 0-40 mcg/min, Intravenous, Titrated, Blakeney, Dana G, NP, Last Rate: 37.5 mL/hr at 03/16/20 1030, 40 mcg/min at 03/16/20 1030 .  [COMPLETED] octreotide (SANDOSTATIN) 2 mcg/mL load via infusion 50 mcg, 50 mcg, Intravenous, Once, 50 mcg at 03/15/20 2246 **AND** octreotide (SANDOSTATIN) 500 mcg in sodium chloride 0.9 % 250 mL (2 mcg/mL) infusion, 50 mcg/hr, Intravenous, Continuous, Siadecki, SFelix Ahmadi MD, Last Rate: 25 mL/hr at 03/16/20 0612, 50 mcg/hr at 03/16/20 0612 .  ondansetron (ZOFRAN) injection 4 mg, 4 mg, Intravenous, Q6H PRN, BAwilda Bill NP, 4 mg at 03/16/20 0806 .  pantoprazole (PROTONIX) 80 mg in sodium chloride 0.9 % 100 mL (0.8 mg/mL) infusion, 8 mg/hr, Intravenous, Continuous, Siadecki, Sebastian, MD, Last Rate: 10 mL/hr at 03/16/20 1029, 8 mg/hr at 03/16/20 1029 .  phytonadione (VITAMIN K) 10 mg in dextrose 5 % 50 mL IVPB, 10 mg, Intravenous, Once, GTyler Pita MD, Last Rate: 50 mL/hr at 03/16/20 1024, 10 mg at 03/16/20 1024 .  sodium  bicarbonate 150 mEq in sterile water 1,000 mL infusion, , Intravenous, Continuous, Blakeney, DDreama Saa NP .  vasopressin (PITRESSIN) 50 Units in dextrose 5 % 250 mL (0.2 Units/mL) infusion, 0-0.8 Units/min, Intravenous, Continuous, Blakeney, DDreama Saa NP, Last Rate: 150 mL/hr at 03/16/20 1039, 0.5 Units/min at 03/16/20 1039  Family History  Problem Relation Age of Onset  . Cancer Father   .  Heart attack Brother      Social History   Tobacco Use  . Smoking status: Never Smoker  . Smokeless tobacco: Never Used  Vaping Use  . Vaping Use: Never used  Substance Use Topics  . Alcohol use: No  . Drug use: Never    Allergies as of 03/15/2020 - Review Complete 03/15/2020  Allergen Reaction Noted  . Morphine and related Nausea And Vomiting 06/08/2018    Review of Systems:    All systems reviewed and negative except where noted in HPI.   Physical Exam:  Vital signs in last 24 hours: Temp:  [97.3 F (36.3 C)-98.3 F (36.8 C)] 97.9 F (36.6 C) (03/11 1009) Pulse Rate:  [59-95] 75 (03/11 1009) Resp:  [16-26] 19 (03/11 1009) BP: (59-116)/(36-70) 114/54 (03/11 1009) SpO2:  [93 %-100 %] 98 % (03/11 1009) Weight:  [96 kg] 96 kg (03/10 2209) Last BM Date: 03/16/20 General:   Pleasant, cooperative in NAD Head:  Normocephalic and atraumatic. Eyes:   No icterus.   Conjunctiva pale. PERRLA. Ears:  Normal auditory acuity. Neck:  Supple; no masses or thyroidomegaly Lungs: Respirations even and unlabored. Lungs clear to auscultation bilaterally.   No wheezes, crackles, or rhonchi.  Heart:  Regular rate and rhythm;  Without murmur, clicks, rubs or gallops Abdomen:  Soft, nondistended, nontender. Normal bowel sounds. No appreciable masses or hepatomegaly.  No rebound or guarding.  Rectal:  Not performed. Msk:  Symmetrical without gross deformities.generalized weakness Extremities:  2+ edema, cyanosis or clubbing. Neurologic:  Alert and oriented x3;  grossly normal neurologically. Skin:  Intact  without significant lesions or rashes. Psych:  Alert and cooperative. Normal affect.  LAB RESULTS: CBC Latest Ref Rng & Units 03/16/2020 03/16/2020 03/15/2020  WBC 4.0 - 10.5 K/uL 19.9(H) 20.6(H) 13.0(H)  Hemoglobin 13.0 - 17.0 g/dL 7.6(L) 7.6(L) 6.8(L)  Hematocrit 39.0 - 52.0 % 21.2(L) 22.0(L) 20.3(L)  Platelets 150 - 400 K/uL 134(L) 131(L) 131(L)    BMET BMP Latest Ref Rng & Units 03/16/2020 03/15/2020 03/15/2020  Glucose 70 - 99 mg/dL 275(H) 204(H) 238(H)  BUN 8 - 23 mg/dL 38(H) 40(H) 38(H)  Creatinine 0.61 - 1.24 mg/dL 1.68(H) 1.93(H) 1.96(H)  Sodium 135 - 145 mmol/L 138 139 139  Potassium 3.5 - 5.1 mmol/L 4.4 4.1 4.4  Chloride 98 - 111 mmol/L 114(H) 113(H) 109  CO2 22 - 32 mmol/L 19(L) 20(L) 18(L)  Calcium 8.9 - 10.3 mg/dL 6.8(L) 7.2(L) 7.8(L)    LFT Hepatic Function Latest Ref Rng & Units 03/16/2020 03/15/2020 03/14/2020  Total Protein 6.5 - 8.1 g/dL 3.7(L) 3.6(L) 6.5  Albumin 3.5 - 5.0 g/dL 2.1(L) 2.2(L) 3.8  AST 15 - 41 U/L 31 30 34  ALT 0 - 44 U/L 25 24 32  Alk Phosphatase 38 - 126 U/L 43 42 82  Total Bilirubin 0.3 - 1.2 mg/dL 1.3(H) 1.0 1.6(H)     STUDIES: DG Chest Port 1 View  Result Date: 03/16/2020 CLINICAL DATA:  Central venous catheter placement EXAM: PORTABLE CHEST 1 VIEW COMPARISON:  03/15/2020 FINDINGS: Left internal jugular central venous catheter has been placed with its tip oriented slightly horizontally within the superior vena cava. Lung volumes are small but are clear. No pneumothorax or pleural effusion. Cardiac size within normal limits. IMPRESSION: Left subclavian central venous catheter tip oriented horizontally within the superior vena cava. No pneumothorax. Electronically Signed   By: Fidela Salisbury MD   On: 03/16/2020 01:50   DG Chest Portable 1 View  Result Date: 03/15/2020 CLINICAL DATA:  Hypotension, weakness EXAM: PORTABLE CHEST 1 VIEW COMPARISON:  None. FINDINGS: Lungs volumes are small, but are symmetric and are clear. No pneumothorax or pleural  effusion. Cardiac size within normal limits. Pulmonary vascularity is normal. Osseous structures are age-appropriate. No acute bone abnormality. IMPRESSION: No active disease. Electronically Signed   By: Fidela Salisbury MD   On: 03/15/2020 22:58   CT Angio Abd/Pel W and/or Wo Contrast  Result Date: 03/16/2020 CLINICAL DATA:  Gastrointestinal hemorrhage, recent endoscopic variceal ligation EXAM: CTA ABDOMEN AND PELVIS WITHOUT AND WITH CONTRAST TECHNIQUE: Multidetector CT imaging of the abdomen and pelvis was performed using the standard protocol during bolus administration of intravenous contrast. Multiplanar reconstructed images and MIPs were obtained and reviewed to evaluate the vascular anatomy. CONTRAST:  13m OMNIPAQUE IOHEXOL 350 MG/ML SOLN COMPARISON:  MRI 10/26/2019 FINDINGS: VASCULAR Aorta: Normal caliber. No aneurysm or dissection. Mild atherosclerotic calcification. No periaortic inflammatory change identified. Celiac: Widely patent.  Normal anatomic configuration. SMA: Widely patent. Renals: Dual right and single left renal arteries are widely patent and demonstrate normal vascular morphology. No aneurysm. IMA: Widely patent Inflow: Mild atherosclerotic calcification without hemodynamically significant stenosis. No aneurysm or dissection. Internal iliac arteries are patent bilaterally. Proximal Outflow: Widely patent Veins: There is chronic portal vein thrombosis with cavernous transformation of the portal vein. Recanalized vasculature is relatively diminutive. There is a prominent portosystemic venous collateral arising from a network of a duodenal varices within the third portion of the duodenum which ultimately communicates with the right gonadal vein. This represents the dominant form of portosystemic collateralization. Iliofemoral venous system and inferior vena cava are unremarkable. Renal veins are patent bilaterally. Splenic vein is patent. Review of the MIP images confirms the above findings.  NON-VASCULAR Lower chest: The visualized lung bases are clear. Moderate coronary artery calcification. Global cardiac size is within normal limits. No pericardial effusion. Hepatobiliary: Cirrhosis. Slight interval parenchymal atrophy when compared to prior MRI examination. No focal intrahepatic mass or intrahepatic biliary ductal dilation. Cholecystectomy has been performed. Pancreas: Multiple small portal venous collaterals are identified within the pancreatic head. Otherwise unremarkable Spleen: Splenomegaly has improved in the interval since prior examination. Adrenals/Urinary Tract: Adrenal glands are unremarkable. Kidneys are normal, without renal calculi, focal lesion, or hydronephrosis. Bladder is unremarkable. Stomach/Bowel: There is bowel wall thickening and periluminal stranding surrounding the duodenum, mucosal fold thickening throughout the small bowel, and marked circumferential bowel wall thickening involving the ascending, transverse, and descending colon. The extent of disease suggests mesenteric venous vascular congestion and resulting bowel wall edema. There is no active extravasation of contrast identified to suggest active gastrointestinal hemorrhage. There are, however, multiple large varices identified within the third portion of the duodenum, as mentioned above. Mild sigmoid diverticulosis. Single loop of unremarkable small bowel is seen within a broad-based supraumbilical ventral hernia. No free intraperitoneal gas. Mild ascites is present, slightly increased in volume since prior examination. Lymphatic: No pathologic adenopathy within the abdomen and pelvis. Reproductive: The prostate is partially obscured by streak artifact from left hip arthroplasty. The visualized portion is unremarkable. Other: Small fat containing umbilical hernia. Musculoskeletal: Left total hip arthroplasty has been performed. Osseous structures are age-appropriate. No lytic or blastic bone lesions. 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 Relatively diminutive venous collateralization. Dominant collateral arises from multiple prominent duodenal varices and is a mesenteric-right gonadal portosystemic collateral. No active gastrointestinal hemorrhage identified NON-VASCULAR Cirrhosis with progressive parenchymal volume loss and increasing ascites when compared to prior examination of 10/26/2019. Resolved splenomegaly,  of unclear etiology. Diffuse marked bowel wall thickening and periluminal edema involving the duodenum, small bowel, and much of the colon and certainly representing mesenteric venous vascular congestion secondary to intrahepatic and pre hepatic portal venous hypertension. Vascular interventional radiology consultation may be helpful to assess for the potential for direct portal-inferior vena caval shunt creation utilizing small portal venous collaterals as a potential target. Alternatively, surgical consultation could be considered for creation of a potential warrant shunt given the patency of the splenic vein and left renal vein. Either shunt would allow decompression of the mesenteric venous system for treatment of both extensive bowel edema and decompression of the duodenal varices. Electronically Signed   By: Fidela Salisbury MD   On: 03/16/2020 05:39      Impression / Plan:   Dillon Graves is a 70 y.o. male with history of diabetes, hypertension, chronic kidney disease, cryptogenic decompensated cirrhosis, known history of esophageal varices s/p variceal ligation, severe portal hypertensive gastropathy and GAVE s/p APC who was just discharged on 3/10 after being treated for hematemesis and hematochezia.  Readmitted with ongoing hematochezia and currently in hemorrhagic shock.  CT angio reveals portal venous hypertension with cavernous transformation and collaterals, duodenal varices within the third portion of duodenum as well as diffuse severe small  bowel edema and colon edema suggestive of mesenteric venous vascular congestion there is no evidence of active GI hemorrhage  Hematochezia, hemorrhagic shock The source of bleeding could be multiple including duodenal varices or diffuse bleeding from small bowel/colon, portal hypertensive gastropathy, gave or esophageal varices which were small This case was reviewed with [facility] hepatologist on-call, Dr. Verne Spurr who recommended endoscopy before transferring for TIPS Will review the case with [facility] interventional radiologist for potential transfer to [facility] Continue octreotide drip, pantoprazole drip N.p.o. Patient is critically ill I have personally discussed case with anesthesiologist to provide support for endoscopy and deemed to be high risk, reassess once he is more stable  Thank you for involving me in the care of this patient.  GI will follow along with you    LOS: 1 day   Sherri Sear, MD  03/16/2020, 10:45 AM   Note: This dictation was prepared with Dragon dictation along with smaller phrase technology. Any transcriptional errors that result from this process are unintentional.

## 2020-03-16 NOTE — Discharge Summary (Signed)
Physician Discharge Summary  Patient ID: CHRISTAN DEFRANCO MRN: [id number] DOB/AGE: [birth year] 70 y.o.  Admit date: 03/15/2020 Discharge date: 03/16/2020    Discharge Diagnoses:  Hemorrhagic shock Acute GI Bleed Acute Blood Loss Anemia Thrombocytopenia Acute on Chronic Renal Failure Lactic Acidosis GERD Type II Diabetes Mellitus     DISCHARGE PLAN BY DIAGNOSIS    Hemorrhagic shock secondary to recurrent acute GI bleed~underwent EGD and argon plasma coagulation due to upper GI bleed secondary to grade II esophageal varices and GAVE on 03/9 and discharged home 03/10 Acute Blood loss Anemia Thrombocytopenia  Hx: Portal venous hypertension and cryptogenic cirrhosis  -Continuous telemetry monitoring  -Aggressive iv fluid and blood product resuscitation to maintain map >65 -Hold outpatient antihypertensives -NPO -Continue octreotide and protonix gtts -Gastroenterology consulted appreciate input -Serial H&H, coags, Fibrinogen -Transfuse for hgb <8 and/or signs of active signs of bleeding  -Has received 2 units Cryoprecipitate, 2 FFP, 1 Platelets -Continue ceftriaxone for possible SBP   Intubated for Airway Protection -Full vent support -Wean FiO2 & PEEP as tolerated to maintain O2 sats >92% -Follow intermittent CXR & ABG as needed -Spontaneous breathing trials when respiratory parameters met and hemodynamically stable with resolution of bleeding -Implement VAP Bundle   Acute on chronic renal failure  Lactic acidosis  -Monitor I&O's / urinary output -Follow BMP -Ensure adequate renal perfusion -Avoid nephrotoxic agents as able -Replace electrolytes as indicated -Trend lactic acid   GERD  -Continue protonix gtt    Type II diabetes mellitus  CBG q4hrs  SSI                                            -Follow ICU Hypo/hyperglycemia protocol                      DISCHARGE SUMMARY    BOSS DANIELSEN is a 70 y.o. y/o male with a PMH of cryptogenic cirrhosis with  esophageal varices recently admitted to [facility] ER on 03/14/20 with upper GI bleed secondary to grade II esophageal varices and GAVE. Underwent EGD and argon plasma coagulation on 03/14/20, and discharged home on 03/15/20.  He returned back to the ER on the night of 03/15/20 with recurrent hematochezia and 1 episode of hematemesisresulting in hemorrhagic shock requiring levophed and vasopressin gtt.  While in the ER octreotide and protonix gtts initiated.  ER physician dicussed case with on call [facility] Gastroenterologist who recommended CTA Abd Pelvis.  Pt admitted to [facility] ICU for additional workup and treatment.  Hospital course complicated by recurrent hematochezia (total of 6 stools) and worsening hypotension.  Pt received the following blood products: 2 units of FFP; 1 unit of platelets; and 4 units of pRBC's. CTA Abd Pelvis revealed diffuse marked bowel wall thickening and periluminal edema involving the duodenum, small bowel, and much of the colon and certainly representing mesenteric venous vascular congestion secondary to intrahepatic and pre hepatic portal venous hypertension.  Discussed case with Gastroenterology and Vascular Surgery at [facility].  Vascular Surgery recommended transfer for possible TIPS.    GI is to perform Endoscopy and has requested pt be intubated.  He is to be transferred to [facility] for possible TIPS.            SIGNIFICANT DIAGNOSTIC STUDIES CTA Abd Pelvis 03/11>>Cirrhosis with progressive parenchymal volume loss and increasing ascites when compared to prior examination of 10/26/2019. Resolved splenomegaly,  of unclear etiology. Diffuse marked bowel wall thickening and periluminal edema involving the duodenum, small bowel, and much of the colon and certainly representing mesenteric venous vascular congestion secondary to intrahepatic and pre hepatic portal venous hypertension. Vascular interventional radiology consultation may be helpful to assess for the potential for direct  portal-inferior vena caval shunt creation utilizing small portal venous collaterals as a potential target. Alternatively, surgical consultation could be considered for creation of a potential warrant shunt given the patency of the splenic vein and left renal vein. Either shunt would allow decompression of the mesenteric venous system for treatment of bothextensive bowel edema and decompression of the duodenal varices.  MICRO DATA  MRSA PCR 03/11>>negative Respiratory Panel by RT-PCR 03/10>>negative  ANTIBIOTICS Ceftriaxone 03/10>>  CONSULTS Gastroenterology  Intensivist  Vascular Surgery  TUBES / LINES Left IJ CVL 03/11>> Indwelling foley catheter 03/11>> Left Femoral Arterial line 03/11>> Endotracheal tube (size 8.0) 03/11>>   Discharge Exam: General: acutely ill appearing male, resting in bed, in NAD. Neuro: A&O x 3, non-focal.  HEENT: [place]/AT. PERRL. Cardiovascular: sinus rhythm, no M/R/G, 2+ radial/1+ distal pulses, no edema Lungs: Respirations even and unlabored.  CTA bilaterally, No W/R/R.  Abdomen: BS x 4, soft, tenderness present, ND.  Musculoskeletal: No gross deformities, no edema.  Skin: Intact, pale, no rashes.  Vitals:   03/16/20 1100 03/16/20 1119 03/16/20 1130 03/16/20 1200  BP: 94/62  (!) 77/36 95/61  Pulse: 80 80 83 (!) 101  Resp: 19 19 18 11   Temp:  97.8 F (36.6 C)    TempSrc:  Oral    SpO2: 99% 99% 98% 100%  Weight:      Height:         Discharge Labs  BMET Recent Labs  Lab 03/14/20 1333 03/15/20 0219 03/15/20 2213 03/16/20 0348  NA 141 139 139 138  K 4.0 4.4 4.1 4.4  CL 103 109 113* 114*  CO2 24 18* 20* 19*  GLUCOSE 150* 238* 204* 275*  BUN 37* 38* 40* 38*  CREATININE 1.88* 1.96* 1.93* 1.68*  CALCIUM 9.4 7.8* 7.2* 6.8*    CBC Recent Labs  Lab 03/15/20 2213 03/16/20 0348 03/16/20 0646  HGB 6.8* 7.6* 7.6*  HCT 20.3* 22.0* 21.2*  WBC 13.0* 20.6* 19.9*  PLT 131* 131* 134*    Anti-Coagulation Recent Labs  Lab 03/14/20 1333  03/15/20 2213 03/16/20 0348 03/16/20 0729  INR 1.3* 1.8* 1.8* 2.1*          Allergies as of 03/16/2020      Reactions   Morphine And Related Nausea And Vomiting      Medication List    STOP taking these medications   aspirin 81 MG tablet   atorvastatin 80 MG tablet Commonly known as: LIPITOR   Basaglar KwikPen 100 UNIT/ML   ergocalciferol 1.25 MG (50000 UT) capsule Commonly known as: VITAMIN D2   ezetimibe 10 MG tablet Commonly known as: ZETIA   Febuxostat 80 MG Tabs   furosemide 80 MG tablet Commonly known as: LASIX   hydrOXYzine 50 MG tablet Commonly known as: ATARAX/VISTARIL   Iron-Vitamin C 65-125 MG Tabs   Janumet XR [phone] MG Tb24 Generic drug: SitaGLIPtin-MetFORMIN HCl   Jardiance 25 MG Tabs tablet Generic drug: empagliflozin   lactulose 10 GM/15ML solution Commonly known as: CHRONULAC   Milk Thistle 500 MG Caps   multivitamin tablet   nadolol 40 MG tablet Commonly known as: CORGARD   nitroGLYCERIN 0.4 MG SL tablet Commonly known as: NITROSTAT   omeprazole 40  MG capsule Commonly known as: PRILOSEC   ondansetron 8 MG disintegrating tablet Commonly known as: Zofran ODT   Ozempic (1 MG/DOSE) 4 MG/3ML Sopn Generic drug: Semaglutide (1 MG/DOSE)   Prolensa 0.07 % Soln Generic drug: Bromfenac Sodium Replaced by: ketorolac 0.5 % ophthalmic solution   rifaximin 550 MG Tabs tablet Commonly known as: XIFAXAN   spironolactone 50 MG tablet Commonly known as: ALDACTONE   Vascepa 1 g capsule Generic drug: icosapent Ethyl   vitamin C 1000 MG tablet   Zinc 50 MG Tabs     TAKE these medications   cefTRIAXone 1 g in sodium chloride 0.9 % 100 mL Inject 1 g into the vein daily.   cefTRIAXone 2 g in sodium chloride 0.9 % 100 mL Inject 2 g into the vein daily.   cycloSPORINE 0.05 % ophthalmic emulsion Commonly known as: RESTASIS Place 1 drop into both eyes 2 (two) times daily.   fentaNYL 100 MCG/2ML injection Commonly known as:  SUBLIMAZE Inject 0.5 mLs (25 mcg total) into the vein every 2 (two) hours as needed for moderate pain or severe pain.   fentaNYL 100 MCG/2ML injection Commonly known as: SUBLIMAZE Inject 0.5 mLs (25 mcg total) into the vein every 15 (fifteen) minutes as needed (to achieve RASS & CPOT goal.).   fentaNYL 100 MCG/2ML injection Commonly known as: SUBLIMAZE Inject 0.5-2 mLs (25-100 mcg total) into the vein every 30 (thirty) minutes as needed (to maintain RASS & CPOT goal.).   insulin aspart 100 UNIT/ML injection Commonly known as: novoLOG Inject 0-9 Units into the skin every 4 (four) hours.   ketorolac 0.5 % ophthalmic solution Commonly known as: ACULAR Place 1 drop into both eyes 2 (two) times daily. Start taking on: March 17, 2020 Replaces: Prolensa 0.07 % Soln   midazolam 1 mg/mL Soln Commonly known as: VERSED Inject 1-2 mg into the vein every 2 (two) hours as needed.   MIDAZOLAM 50MG/50ML (1MG/ML) PREMIX INFUSION Inject 0-10 mg/hr into the vein continuous.   norepinephrine 16-5 MG/250ML-% Soln Commonly known as: LEVOPHED Inject 0-40 mcg/min into the vein continuous.   octreotide 500 mcg in sodium chloride 0.9 % 250 mL Inject 50 mcg/hr into the vein continuous.   ondansetron 4 MG/2ML Soln injection Commonly known as: ZOFRAN Inject 2 mLs (4 mg total) into the vein every 6 (six) hours as needed for nausea.   sodium bicarbonate 150 mEq in sterile water 850 mL Inject 100 mL/hr into the vein continuous.   vasopressin 50 Units in dextrose 5 % 250 mL Inject 0-0.8 Units/min into the vein continuous.         Disposition: ICU  Discharged Condition: CHAPMAN MATTEUCCI has met maximum benefit of inpatient care and [facility].  Transfer to [facility] for TIPS    Time spent on disposition:  60 Minutes.   Signed: Darel Hong, AGACNP-BC [facility] Pager: [phone]

## 2020-03-16 NOTE — Discharge Summary (Signed)
Physician Discharge Summary  Patient ID: Dillon Graves MRN: [id number] DOB/AGE: [birth year] 70 y.o.  Admit date: 03/15/2020 Discharge date: 03/16/2020                 DISCHARGE SUMMARY   Dillon Graves is a 70 y.o. y/o male with a PMH of cryptogenic cirrhosis with esophageal varices recently admitted to [facility] ER on 03/14/20 with upper GI bleed secondary to grade II esophageal varices and GAVE. Underwent EGD and argon plasma coagulation on 03/14/20, and discharged home on 03/15/20.  He returned back to the ER on the night of 03/15/20 with recurrent hematochezia and 1 episode of hematemesis resulting in hemorrhagic shock requiring levophed and vasopressin gtt.  While in the ER octreotide and protonix gtts initiated.  ER physician dicussed case with on call [facility] Gastroenterologist who recommended CTA Abd Pelvis.  Pt admitted to [facility] ICU for additional workup and treatment.  Hospital course complicated by recurrent hematochezia (total of 6 stools) and worsening hypotension.  Pt received the following blood products: 2 units of FFP; 1 unit of platelets; and 4 units of pRBC's. CTA Abd Pelvis revealed diffuse marked bowel wall thickening and periluminal edema involving the duodenum, small bowel, and much of the colon and certainly representing mesenteric venous vascular congestion secondary to intrahepatic and pre hepatic portal venous hypertension.  Discussed case with Gastroenterology and Vascular Surgery at [facility].  Vascular Surgery recommended transfer to [facility] for possible TIPS.    SIGNIFICANT DIAGNOSTIC STUDIES CTA Abd Pelvis 03/11>>Cirrhosis with progressive parenchymal volume loss and increasing ascites when compared to prior examination of 10/26/2019. Resolved splenomegaly, of unclear etiology. Diffuse marked bowel wall thickening and periluminal edema involving the duodenum, small bowel, and much of the colon and certainly representing mesenteric venous vascular congestion secondary to  intrahepatic and pre hepatic portal venous hypertension. Vascular interventional radiology consultation may be helpful to assess for the potential for direct portal-inferior vena caval shunt creation utilizing small portal venous collaterals as a potential target. Alternatively, surgical consultation could be considered for creation of a potential warrant shunt given the patency of the splenic vein and left renal vein. Either shunt would allow decompression of the mesenteric venous system for treatment of bothextensive bowel edema and decompression of the duodenal varices.  SIGNIFICANT EVENTS 03/10: Pt admitted to [facility] ICU with hemorrhagic shock in the setting of acute GI Bleed requiring levophed and vasopressin gtts   MICRO DATA  MRSA PCR 03/11>>negative Respiratory Panel by RT-PCR 03/10>>negative   ANTIBIOTICS Ceftriaxone 03/10>>  CONSULTS Gastroenterology  Intensivist  Vascular Surgery   TUBES / LINES Left IJ CVL 03/11>> Indwelling foley catheter 03/11>>   Discharge Exam: General: acutely ill appearing male, resting in bed, in NAD. Neuro: A&O x 3, non-focal.  HEENT: Dillon Graves/AT. PERRL. Cardiovascular: sinus rhythm, no M/R/G, 2+ radial/1+ distal pulses, no edema Lungs: Respirations even and unlabored.  CTA bilaterally, No W/R/R.  Abdomen: BS x 4, soft, tenderness present, ND.  Musculoskeletal: No gross deformities, no edema.  Skin: Intact, pale, no rashes.   Vitals:   03/16/20 0530 03/16/20 0542 03/16/20 0545 03/16/20 0555  BP: (!) 92/56 (!) 100/59 (!) 107/54   Pulse: 84 85 75   Resp: (!) 21 19 19    Temp:  97.9 F (36.6 C) 97.9 F (36.6 C) 97.9 F (36.6 C)  TempSrc:  Oral Oral Oral  SpO2: 98% 93% 98%   Weight:      Height:         Discharge Labs  BMET Recent Labs  Lab 03/14/20 1333 03/15/20 0219 03/15/20 2213 03/16/20 0348  NA 141 139 139 138  K 4.0 4.4 4.1 4.4  CL 103 109 113* 114*  CO2 24 18* 20* 19*  GLUCOSE 150* 238* 204* 275*  BUN 37* 38* 40* 38*   CREATININE 1.88* 1.96* 1.93* 1.68*  CALCIUM 9.4 7.8* 7.2* 6.8*    CBC Recent Labs  Lab 03/15/20 0522 03/15/20 2213 03/16/20 0348  HGB 10.4* 6.8* 7.6*  HCT 29.7* 20.3* 22.0*  WBC 7.9 13.0* 20.6*  PLT 66* 131* 131*    Anti-Coagulation Recent Labs  Lab 03/14/20 1333 03/15/20 2213 03/16/20 0348  INR 1.3* 1.8* 1.8*          Allergies as of 03/16/2020      Reactions   Morphine And Related Nausea And Vomiting      Medication List    STOP taking these medications   aspirin 81 MG tablet   atorvastatin 80 MG tablet Commonly known as: LIPITOR   Basaglar KwikPen 100 UNIT/ML   ergocalciferol 1.25 MG (50000 UT) capsule Commonly known as: VITAMIN D2   ezetimibe 10 MG tablet Commonly known as: ZETIA   Febuxostat 80 MG Tabs   furosemide 80 MG tablet Commonly known as: LASIX   hydrOXYzine 50 MG tablet Commonly known as: ATARAX/VISTARIL   Iron-Vitamin C 65-125 MG Tabs   Janumet XR [phone] MG Tb24 Generic drug: SitaGLIPtin-MetFORMIN HCl   Jardiance 25 MG Tabs tablet Generic drug: empagliflozin   lactulose 10 GM/15ML solution Commonly known as: CHRONULAC   Milk Thistle 500 MG Caps   multivitamin tablet   nadolol 40 MG tablet Commonly known as: CORGARD   nitroGLYCERIN 0.4 MG SL tablet Commonly known as: NITROSTAT   omeprazole 40 MG capsule Commonly known as: PRILOSEC   ondansetron 8 MG disintegrating tablet Commonly known as: Zofran ODT   Ozempic (1 MG/DOSE) 4 MG/3ML Sopn Generic drug: Semaglutide (1 MG/DOSE)   Prolensa 0.07 % Soln Generic drug: Bromfenac Sodium Replaced by: ketorolac 0.5 % ophthalmic solution   rifaximin 550 MG Tabs tablet Commonly known as: XIFAXAN   spironolactone 50 MG tablet Commonly known as: ALDACTONE   Vascepa 1 g capsule Generic drug: icosapent Ethyl   vitamin C 1000 MG tablet   Zinc 50 MG Tabs     TAKE these medications   cefTRIAXone 1 g in sodium chloride 0.9 % 100 mL Inject 1 g into the vein  daily.   cycloSPORINE 0.05 % ophthalmic emulsion Commonly known as: RESTASIS Place 1 drop into both eyes 2 (two) times daily.   insulin aspart 100 UNIT/ML injection Commonly known as: novoLOG Inject 0-9 Units into the skin every 4 (four) hours.   ketorolac 0.5 % ophthalmic solution Commonly known as: ACULAR Place 1 drop into both eyes 2 (two) times daily. Start taking on: March 17, 2020 Replaces: Prolensa 0.07 % Soln   norepinephrine 16-5 MG/250ML-% Soln Commonly known as: LEVOPHED Inject 0-40 mcg/min into the vein continuous.   octreotide 500 mcg in sodium chloride 0.9 % 250 mL Inject 50 mcg/hr into the vein continuous.   ondansetron 4 MG/2ML Soln injection Commonly known as: ZOFRAN Inject 2 mLs (4 mg total) into the vein every 6 (six) hours as needed for nausea.   vasopressin 50 Units in dextrose 5 % 250 mL Inject 0-0.8 Units/min into the vein continuous.        Disposition: Transfer to [facility]   Sonda Rumble, [place]  Pulmonary/Critical Care Pager [phone] (please  enter 7 digits) PCCM Consult Pager [phone] (please enter 7 digits)

## 2020-03-16 NOTE — Progress Notes (Signed)
Family discussion:  I have discussed with patient's wife and his son regarding his medical condition.  Informed them that patient is accepted to [facility] ICU He will be intubated for transport to [facility].  After he is intubated, will perform upper endoscopy/enteroscopy to identify source of bleeding for temporizing measures. Patient's family is agreeable with endoscopy before he is transferred to tertiary care center   Arlyss Repress, MD [address]  [address]  [place], [place] [address]  Main: [phone]  Fax: [phone] Pager: [phone]

## 2020-03-16 NOTE — Progress Notes (Signed)
PHARMACY NOTE:  ANTIMICROBIAL DOSAGE ADJUSTMENT  Current antimicrobial regimen includes a mismatch between antimicrobial dosage and indication.  As per policy approved by the Pharmacy & Therapeutics and Medical Executive Committees, the antimicrobial dosage will be adjusted accordingly.  Current antimicrobial dosage:  Ceftriaxone 1 gram IV every 24 hours  Indication: SBP prophylaxis  Antimicrobial dosage has been changed to:  Ceftriaxone 2 grams IV every 24 hours   Thank you for allowing pharmacy to be a part of this patient's care.  Lowella Bandy, [facility] 03/16/2020 7:35 AM

## 2020-03-17 LAB — TYPE AND SCREEN
ABO/RH(D): O NEG
Antibody Screen: NEGATIVE
Unit division: 0
Unit division: 0
Unit division: 0
Unit division: 0
Unit division: 0
Unit division: 0
Unit division: 0
Unit division: 0
Unit division: 0
Unit division: 0
Unit division: 0

## 2020-03-17 LAB — BPAM FFP
Blood Product Expiration Date: 202203162359
Blood Product Expiration Date: 202203162359
ISSUE DATE / TIME: 202203110049
ISSUE DATE / TIME: 202203110311
Unit Type and Rh: 5100
Unit Type and Rh: 5100

## 2020-03-17 LAB — PREPARE CRYOPRECIPITATE
Unit division: 0
Unit division: 0
Unit division: 0
Unit division: 0

## 2020-03-17 LAB — BPAM PLATELET PHERESIS
Blood Product Expiration Date: 202203132359
ISSUE DATE / TIME: 202203110509
Unit Type and Rh: 5100

## 2020-03-17 LAB — BPAM RBC
Blood Product Expiration Date: 202203182359
Blood Product Expiration Date: 202203202359
Blood Product Expiration Date: 202203222359
Blood Product Expiration Date: 202204042359
Blood Product Expiration Date: 202204042359
Blood Product Expiration Date: 202204112359
Blood Product Expiration Date: 202204112359
Blood Product Expiration Date: 202204112359
Blood Product Expiration Date: 202204112359
Blood Product Expiration Date: 202204112359
Blood Product Expiration Date: 202204142359
ISSUE DATE / TIME: 202203102212
ISSUE DATE / TIME: 202203102212
ISSUE DATE / TIME: 202203110215
ISSUE DATE / TIME: 202203110507
ISSUE DATE / TIME: 202203110900
ISSUE DATE / TIME: 202203111113
ISSUE DATE / TIME: 202203111333
ISSUE DATE / TIME: 202203111355
ISSUE DATE / TIME: 202203111355
ISSUE DATE / TIME: 202203111807
Unit Type and Rh: 5100
Unit Type and Rh: 5100
Unit Type and Rh: 5100
Unit Type and Rh: 5100
Unit Type and Rh: 5100
Unit Type and Rh: 5100
Unit Type and Rh: 5100
Unit Type and Rh: 5100
Unit Type and Rh: 5100
Unit Type and Rh: 5100
Unit Type and Rh: 9500

## 2020-03-17 LAB — BPAM CRYOPRECIPITATE
Blood Product Expiration Date: 202203111359
Blood Product Expiration Date: 202203111359
Blood Product Expiration Date: 202203111950
Blood Product Expiration Date: 202203111950
ISSUE DATE / TIME: 202203110900
ISSUE DATE / TIME: 202203110900
ISSUE DATE / TIME: 202203111355
ISSUE DATE / TIME: 202203111355
Unit Type and Rh: 5100
Unit Type and Rh: 5100
Unit Type and Rh: 6200
Unit Type and Rh: 6200

## 2020-03-17 LAB — PREPARE FRESH FROZEN PLASMA: Unit division: 0

## 2020-03-17 LAB — PREPARE PLATELET PHERESIS: Unit division: 0

## 2020-04-06 DEATH — deceased

## 2022-04-10 IMAGING — DX DG CHEST 1V PORT
1 series · 1 of 1 positions shown · non-contrast
Comparison: 03/15/2020

CLINICAL DATA: Central venous catheter placement

EXAM:
PORTABLE CHEST 1 VIEW

[chest ap]
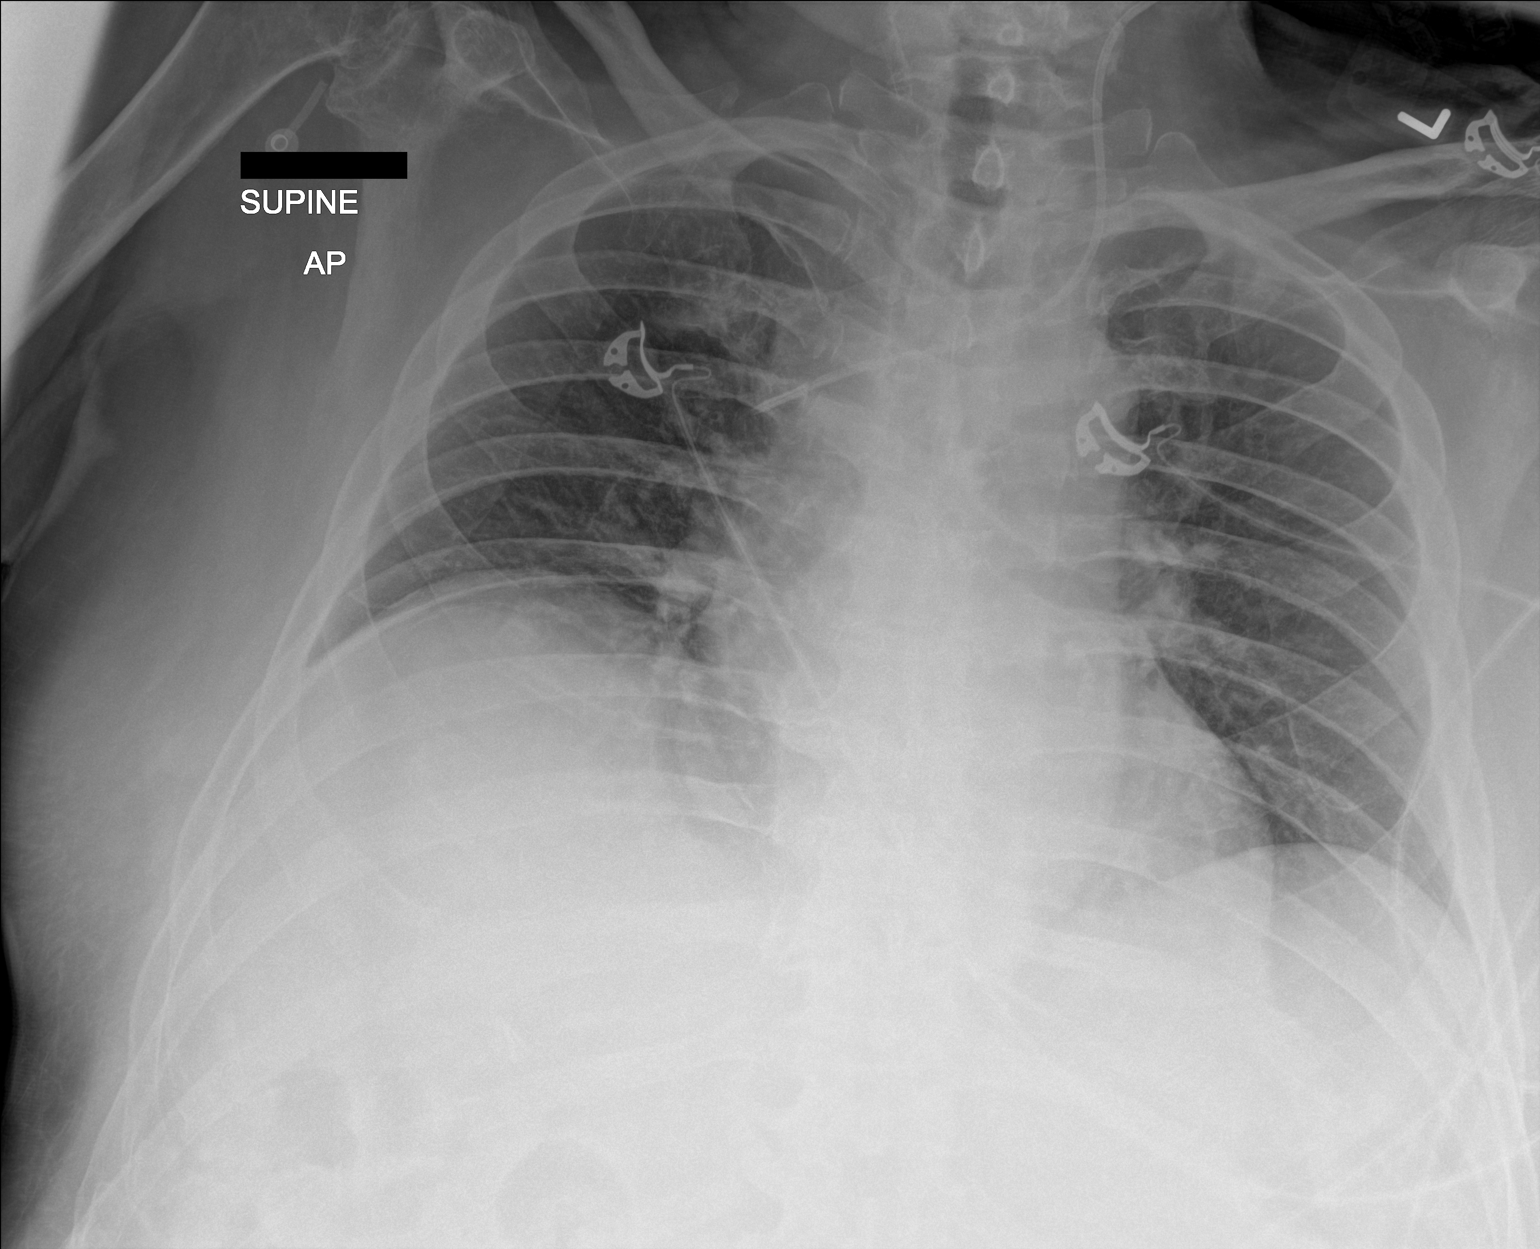

[1 of 1 positions shown; findings below may reference images not displayed]

FINDINGS: Left internal jugular central venous catheter has been placed with
its tip oriented slightly horizontally within the superior vena
cava. Lung volumes are small but are clear. No pneumothorax or
pleural effusion. Cardiac size within normal limits.
IMPRESSION: Left subclavian central venous catheter tip oriented horizontally
within the superior vena cava. No pneumothorax.
